# Patient Record
Sex: Female | Born: 1940 | Race: White | Hispanic: No | Marital: Single | Smoking: Never smoker
Health system: Southern US, Community
[De-identification: ages and names within clinical notes are randomized; demographics above are authoritative.]

## PROBLEM LIST (undated history)

## (undated) DIAGNOSIS — F419 Anxiety disorder, unspecified: Secondary | ICD-10-CM

## (undated) DIAGNOSIS — E039 Hypothyroidism, unspecified: Secondary | ICD-10-CM

## (undated) DIAGNOSIS — E7800 Pure hypercholesterolemia, unspecified: Secondary | ICD-10-CM

## (undated) DIAGNOSIS — R011 Cardiac murmur, unspecified: Secondary | ICD-10-CM

## (undated) DIAGNOSIS — C6991 Malignant neoplasm of unspecified site of right eye: Secondary | ICD-10-CM

## (undated) HISTORY — PX: BREAST SURGERY: SHX581

## (undated) HISTORY — DX: Pure hypercholesterolemia, unspecified: E78.00

## (undated) HISTORY — PX: BUNIONECTOMY: SHX129

## (undated) HISTORY — PX: OSTEOTOMY: SHX137

## (undated) HISTORY — DX: Hypothyroidism, unspecified: E03.9

## (undated) HISTORY — DX: Cardiac murmur, unspecified: R01.1

## (undated) HISTORY — DX: Anxiety disorder, unspecified: F41.9

## (undated) HISTORY — PX: HAMMER TOE SURGERY: SHX385

## (undated) HISTORY — PX: EYE SURGERY: SHX253

---

## 2000-03-23 ENCOUNTER — Encounter (INDEPENDENT_AMBULATORY_CARE_PROVIDER_SITE_OTHER): Payer: Self-pay | Admitting: *Deleted

## 2000-03-23 ENCOUNTER — Encounter: Payer: Self-pay | Admitting: *Deleted

## 2000-03-23 ENCOUNTER — Ambulatory Visit (HOSPITAL_BASED_OUTPATIENT_CLINIC_OR_DEPARTMENT_OTHER): Admission: RE | Admit: 2000-03-23 | Discharge: 2000-03-23 | Payer: Self-pay | Admitting: *Deleted

## 2001-06-30 ENCOUNTER — Other Ambulatory Visit: Admission: RE | Admit: 2001-06-30 | Discharge: 2001-06-30 | Payer: Self-pay | Admitting: *Deleted

## 2002-06-17 ENCOUNTER — Ambulatory Visit (HOSPITAL_COMMUNITY): Admission: RE | Admit: 2002-06-17 | Discharge: 2002-06-17 | Payer: Self-pay | Admitting: Gastroenterology

## 2002-07-08 ENCOUNTER — Other Ambulatory Visit: Admission: RE | Admit: 2002-07-08 | Discharge: 2002-07-08 | Payer: Self-pay

## 2002-10-31 ENCOUNTER — Ambulatory Visit (HOSPITAL_COMMUNITY): Admission: RE | Admit: 2002-10-31 | Discharge: 2002-10-31 | Payer: Self-pay | Admitting: Internal Medicine

## 2002-10-31 ENCOUNTER — Encounter: Payer: Self-pay | Admitting: Internal Medicine

## 2003-07-10 ENCOUNTER — Other Ambulatory Visit: Admission: RE | Admit: 2003-07-10 | Discharge: 2003-07-10 | Payer: Self-pay

## 2004-07-15 ENCOUNTER — Other Ambulatory Visit: Admission: RE | Admit: 2004-07-15 | Discharge: 2004-07-15 | Payer: Self-pay | Admitting: Family Medicine

## 2005-01-27 ENCOUNTER — Encounter: Admission: RE | Admit: 2005-01-27 | Discharge: 2005-01-27 | Payer: Self-pay | Admitting: Family Medicine

## 2005-04-02 ENCOUNTER — Encounter: Admission: RE | Admit: 2005-04-02 | Discharge: 2005-04-02 | Payer: Self-pay | Admitting: Gastroenterology

## 2005-05-23 ENCOUNTER — Encounter: Admission: RE | Admit: 2005-05-23 | Discharge: 2005-05-23 | Payer: Self-pay | Admitting: Gastroenterology

## 2005-05-23 IMAGING — CT CT ABDOMEN W/ CM
1 of 3 series · 14 of 32 positions shown, 19 images · IV contrast (REDICAT/H2O & OMNIPAQUE [ID])
Comparison: none

CLINICAL DATA: Right lower quadrant pain. 
 ABDOMEN CT WITH CONTRAST:
TECHNIQUE: Multidetector CT imaging of the abdomen was performed following the standard protocol during bolus administration of intravenous contrast.
 Contrast:  100 cc of Omnipaque 300.
 The lung bases are clear other than minimal linear scarring.  Small pericardial effusion is noted.  The liver enhances normally with no focal abnormality, and no ductal dilatation is seen.  No calcified gallstones are noted.  The pancreas is normal in size as are the adrenal glands and the spleen.  No adenopathy is seen.  The abdominal aorta is normal in caliber.
TECHNIQUE: Multidetector CT imaging of the pelvis was performed following the standard protocol during bolus administration of intravenous contrast.
 The appendix is well seen and appears normal as is the terminal ileum.  The urinary bladder is unremarkable.  Uterus is normal in size for age.  No adnexal lesion is seen, and no fluid is noted within the pelvis.  No adenopathy is noted.

[Series 2: — · axial · 0.70mm/px · z∈[-363,-43]mm · 14 of 72 slices shown, 19 images]
[im 4/72  soft-tissue]
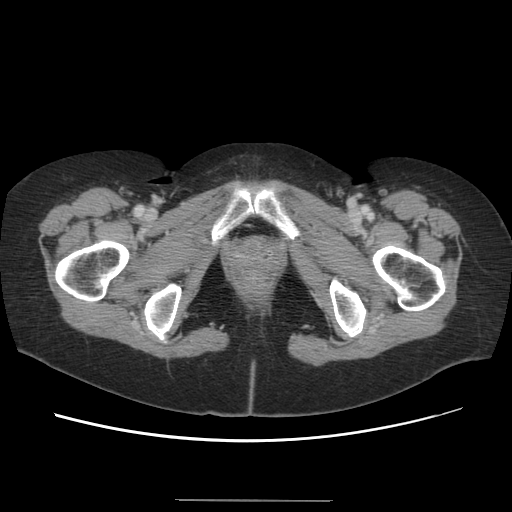
[im 4/72  bone]
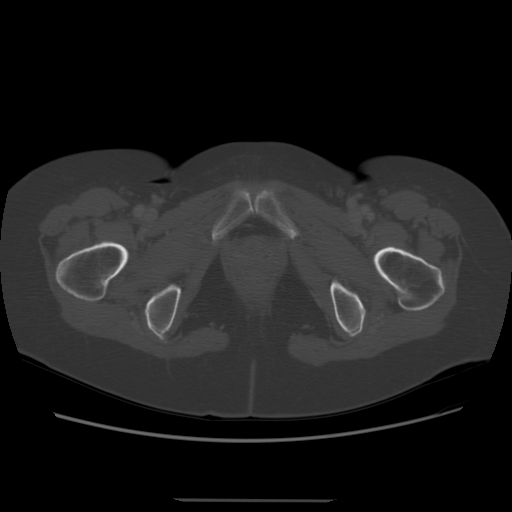
[im 12/72  soft-tissue]
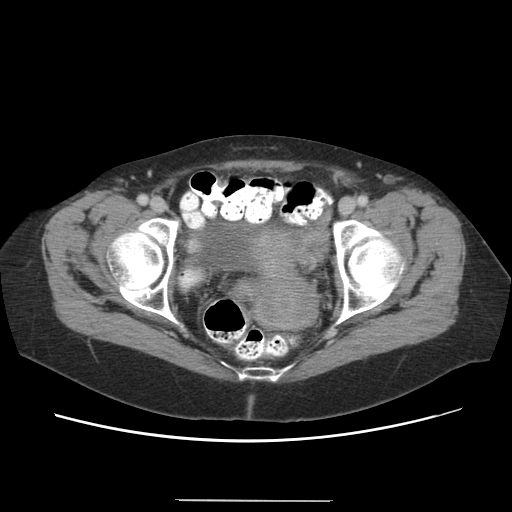
[im 15/72  soft-tissue]
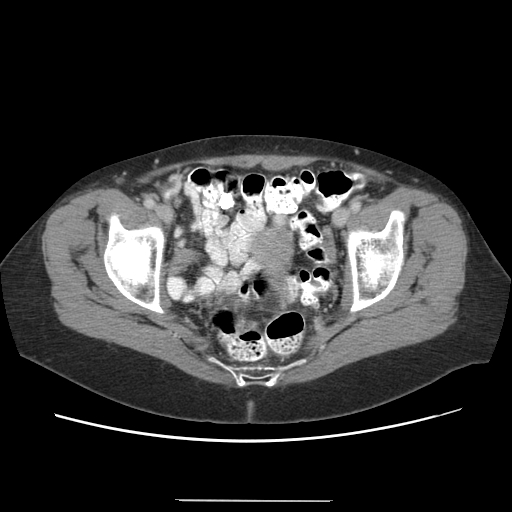
[im 19/72  soft-tissue]
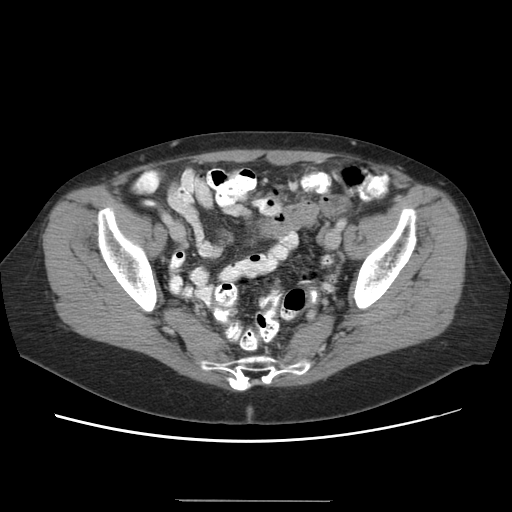
[im 27/72  soft-tissue]
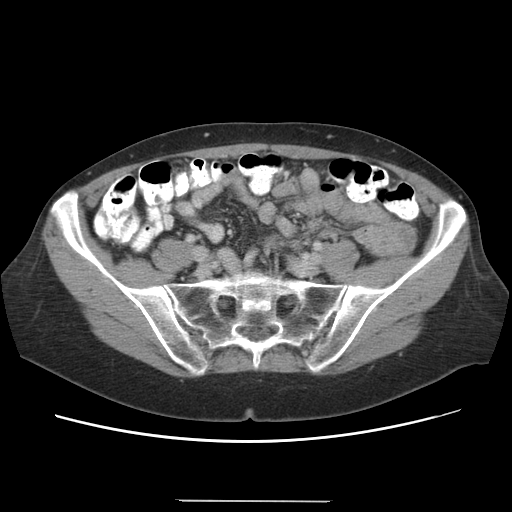
[im 30/72  soft-tissue]
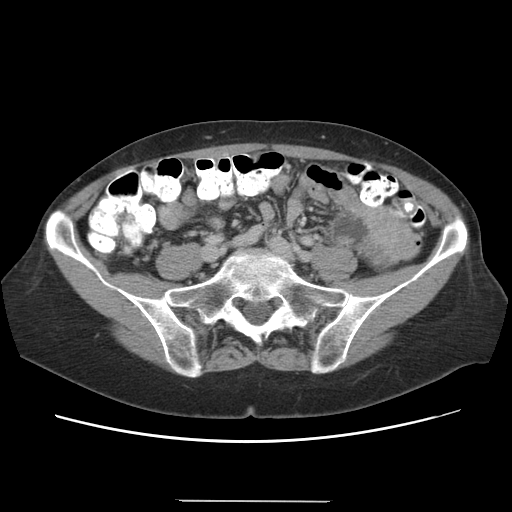
[im 38/72  soft-tissue]
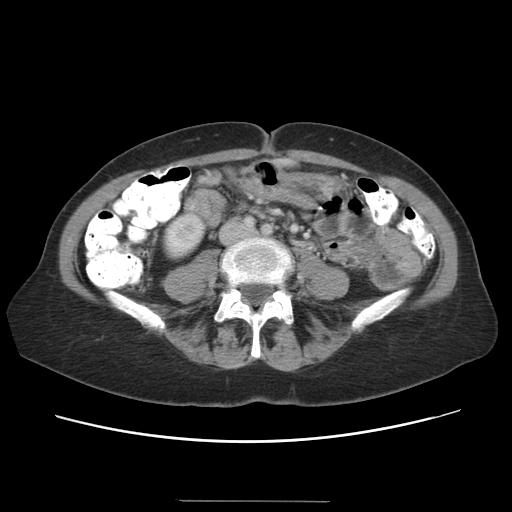
[im 42/72  soft-tissue]
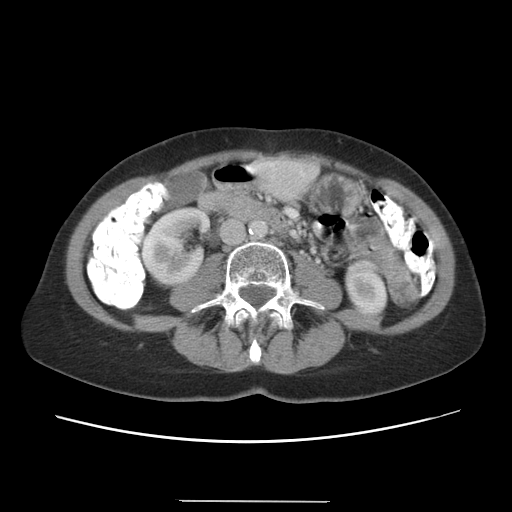
[im 45/72  soft-tissue]
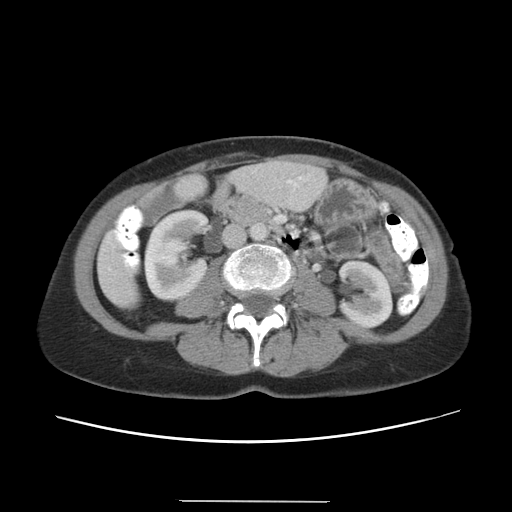
[im 45/72  bone]
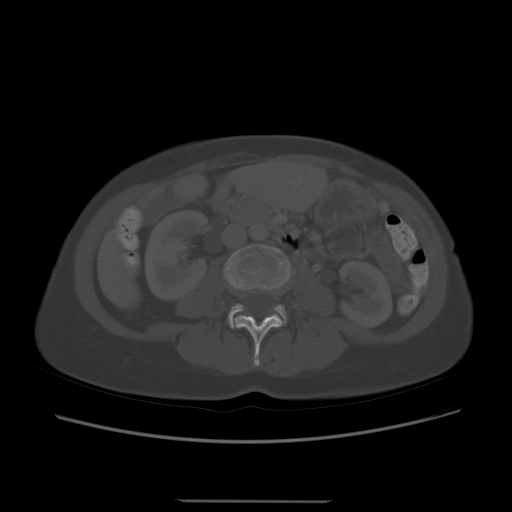
[im 53/72  soft-tissue]
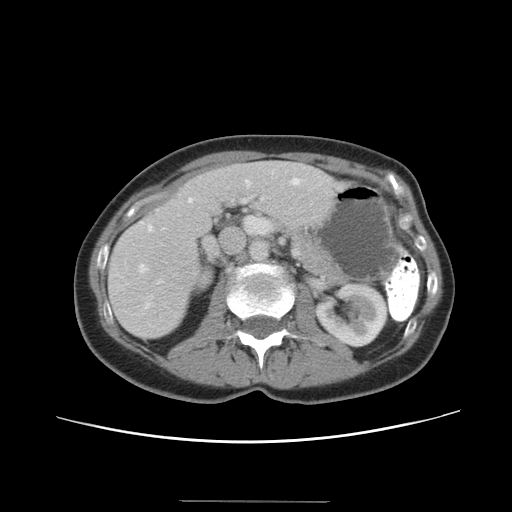
[im 57/72  soft-tissue]
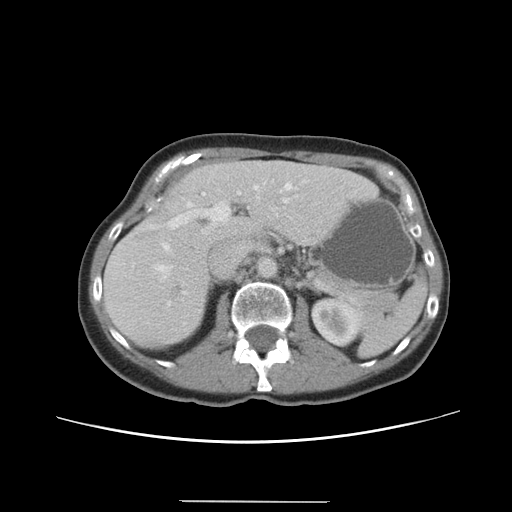
[im 57/72  lung]
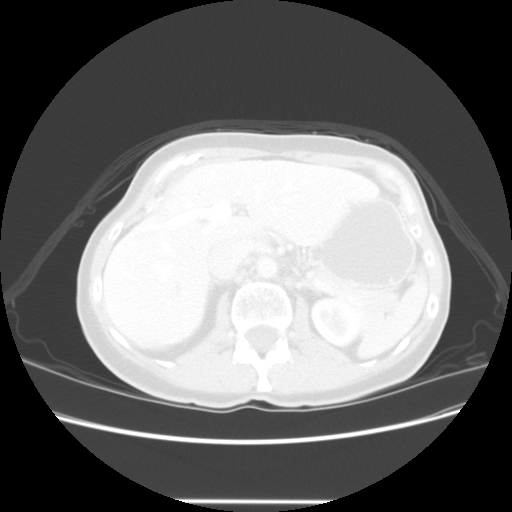
[im 60/72  soft-tissue]
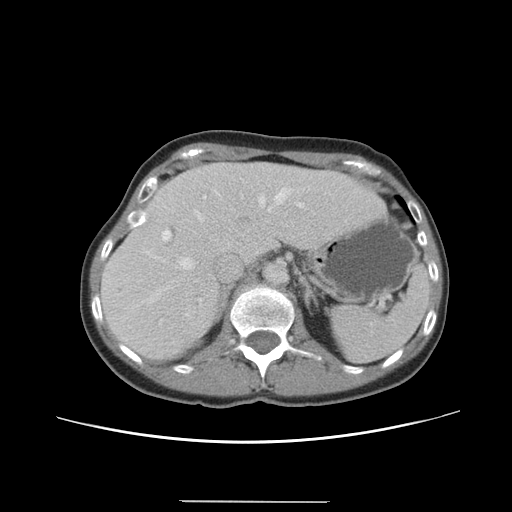
[im 60/72  lung]
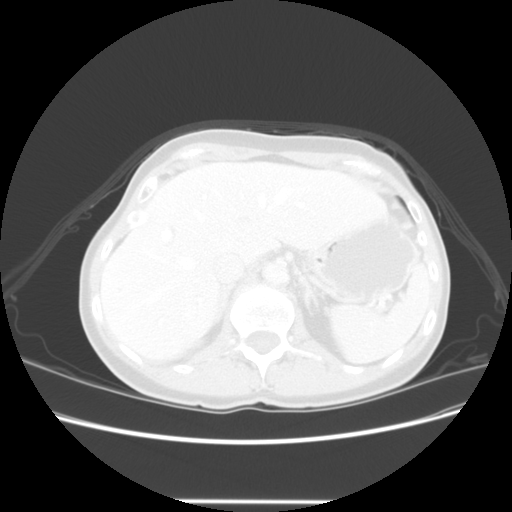
[im 64/72  lung]
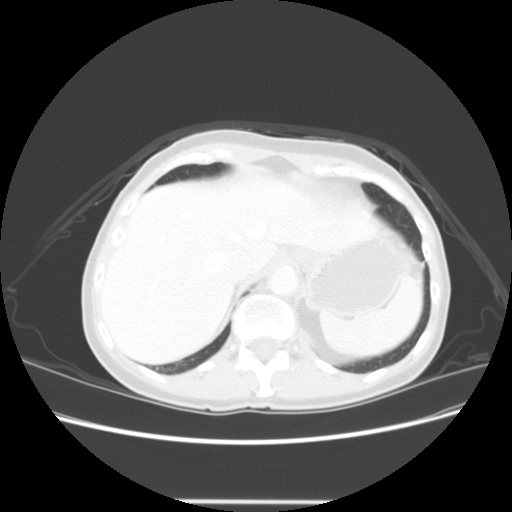
[im 68/72  soft-tissue]
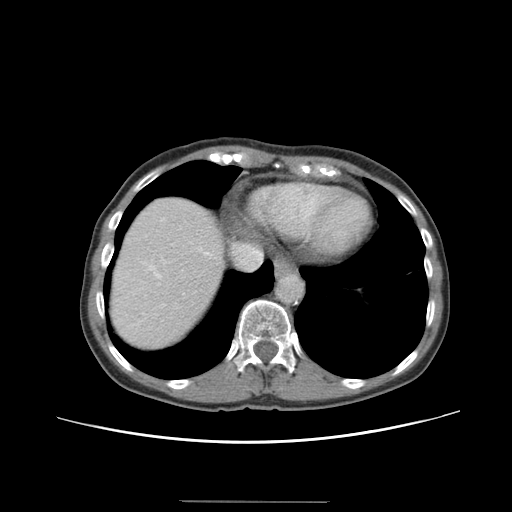
[im 68/72  lung]
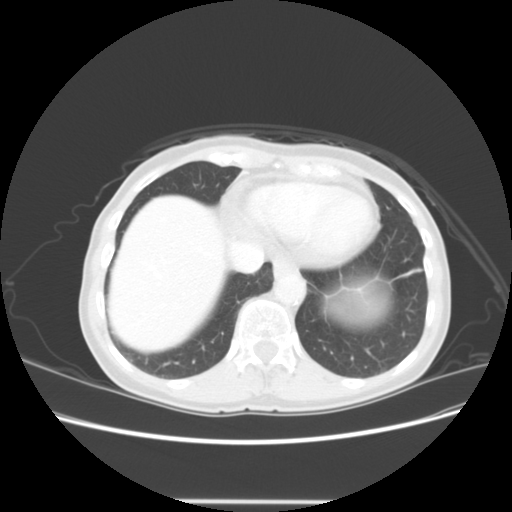

[14 of 32 positions shown; findings below may reference images not displayed]

IMPRESSION: Small pericardial effusion.  No abnormality on CT of the abdomen.
 PELVIS CT WITH CONTRAST:
IMPRESSION: Negative CT of the pelvis for acute abnormality.  The appendix and terminal ileum appear normal.

## 2005-07-28 ENCOUNTER — Encounter: Admission: RE | Admit: 2005-07-28 | Discharge: 2005-07-28 | Payer: Self-pay | Admitting: Family Medicine

## 2007-06-24 ENCOUNTER — Other Ambulatory Visit: Admission: RE | Admit: 2007-06-24 | Discharge: 2007-06-24 | Payer: Self-pay | Admitting: Obstetrics and Gynecology

## 2010-07-01 ENCOUNTER — Other Ambulatory Visit
Admission: RE | Admit: 2010-07-01 | Discharge: 2010-07-01 | Payer: Self-pay | Source: Home / Self Care | Admitting: Obstetrics and Gynecology

## 2010-10-18 DIAGNOSIS — E785 Hyperlipidemia, unspecified: Secondary | ICD-10-CM | POA: Insufficient documentation

## 2011-01-03 DIAGNOSIS — M2040 Other hammer toe(s) (acquired), unspecified foot: Secondary | ICD-10-CM | POA: Insufficient documentation

## 2011-01-03 DIAGNOSIS — L309 Dermatitis, unspecified: Secondary | ICD-10-CM | POA: Insufficient documentation

## 2011-01-31 NOTE — Op Note (Signed)
[place]  Patient:    Alison Marshall, Alison Marshall                      MRN: [id number] Proc. Date: 03/23/00 Attending:  Maisie Fus B. Samuella Cota, M.D. CC:         Heather Roberts, M.D.             Thomas B. Samuella Cota, M.D.                           Operative Report  CCS NUMBER [id number]  PREOPERATIVE DIAGNOSIS:  Abnormal mammogram, left breast.  POSTOPERATIVE DIAGNOSIS:  Abnormal mammogram, left breast.  OPERATION:  Needle localization and biopsy of left breast.  SURGEON:  Maisie Fus B. Samuella Cota, M.D.  ANESTHESIA:  1% Xylocaine local with anesthesia monitoring.  ANESTHESIOLOGIST:  Halford Decamp, M.D.  DESCRIPTION OF PROCEDURE:  The patient was taken to the operating room and placed on the table in supine position.  The left breast was prepped and draped in a sterile field.  The patient had a wire coming through the skin at about the 6 oclock position, angled towards the 3 oclock position.  A curved incision was outlined with the skin marker.  The area was then anesthetized with local and used throughout as necessary.  A small ellipse of skin was taken so as to remove the opening in the skin made by the wire.  Using the wire as a guide, the breast tissue was taken down to the chest wall.  Bleeding was controlled with the cautery.  The specimen was given to the radiologist who x-rayed the specimen and said the area of concern had been removed.  The specimen had been marked immediately with a short suture and latterly with a long suture.  The wound was irrigated.  Hemostasis was obtained.  Subcutaneous tissue was closed with 3-0 Vicryl, and the skin was closed with a running subcuticular suture of 5-0 Vicryl.  Benzoin and 1/2 inch Steri-Strips were used to reinforce the skin closure.  Pressure dressing using 4x4s, ABD, and 4-inch Hypafix was applied.  The patient seemed to tolerate the procedure well and was taken to the PACU in satisfactory condition. DD:   03/23/00 TD:  03/23/00 Job: [id number]

## 2011-01-31 NOTE — Op Note (Signed)
   Alison Marshall, Alison Marshall                         ACCOUNT NO.:  [id number]   MEDICAL RECORD NO.:  [id number]                   PATIENT TYPE:  AMB   LOCATION:  ENDO                                 FACILITY:  [facility]   PHYSICIAN:  John C. Madilyn Fireman, M.D.                 DATE OF BIRTH:  1940/10/24   DATE OF PROCEDURE:  06/17/2002  DATE OF DISCHARGE:                                 OPERATIVE REPORT   PROCEDURE:  Colonoscopy.   INDICATIONS FOR PROCEDURE:  Screening colonoscopy in a 70 year old patient  who also had complaints of constipation.   DESCRIPTION OF PROCEDURE:  The patient was placed in the left lateral  decubitus position then placed on the pulse monitor with continuous low flow  oxygen delivered by nasal cannula. She was sedated with 50 mg IV Demerol and  7 mg IV Versed. The Olympus video colonoscope was inserted into the rectum  and advanced to the cecum, confirmed by transillumination at McBurney's  point and visualization of the ileocecal valve and appendiceal orifice. The  prep was good. The cecum, ascending, transverse, descending and sigmoid  colon all appeared normal with no masses, polyps, diverticula or other  mucosal abnormalities. The rectum likewise appeared normal and retroflexed  view of the anus revealed no obvious internal hemorrhoids. The colonoscope  was then withdrawn and the patient returned to the recovery room in stable  condition. The patient tolerated the procedure well and there were no  immediate complications.   IMPRESSION:  Normal colonoscopy.   PLAN:  Colon screening by sigmoidoscopy in five years.                                               John C. Madilyn Fireman, M.D.    J[facility]/MEDQ  D:  06/17/2002  T:  06/17/2002  Job:  [id number]   cc:   Heather Roberts, M.D.  [address]  [place]  [place] [address]  Fax: [phone]

## 2011-02-12 ENCOUNTER — Encounter (INDEPENDENT_AMBULATORY_CARE_PROVIDER_SITE_OTHER): Payer: Self-pay | Admitting: Surgery

## 2011-05-12 DIAGNOSIS — N951 Menopausal and female climacteric states: Secondary | ICD-10-CM | POA: Insufficient documentation

## 2011-05-12 DIAGNOSIS — E559 Vitamin D deficiency, unspecified: Secondary | ICD-10-CM | POA: Insufficient documentation

## 2011-07-31 ENCOUNTER — Encounter: Payer: Self-pay | Admitting: Surgery

## 2011-09-23 ENCOUNTER — Ambulatory Visit (INDEPENDENT_AMBULATORY_CARE_PROVIDER_SITE_OTHER): Payer: Self-pay | Admitting: Surgery

## 2011-10-07 ENCOUNTER — Ambulatory Visit (INDEPENDENT_AMBULATORY_CARE_PROVIDER_SITE_OTHER): Payer: Self-pay | Admitting: Surgery

## 2011-11-03 ENCOUNTER — Encounter (INDEPENDENT_AMBULATORY_CARE_PROVIDER_SITE_OTHER): Payer: Self-pay | Admitting: General Surgery

## 2011-11-03 DIAGNOSIS — N6019 Diffuse cystic mastopathy of unspecified breast: Secondary | ICD-10-CM | POA: Insufficient documentation

## 2011-11-05 ENCOUNTER — Encounter (INDEPENDENT_AMBULATORY_CARE_PROVIDER_SITE_OTHER): Payer: Self-pay | Admitting: Surgery

## 2011-11-05 ENCOUNTER — Ambulatory Visit (INDEPENDENT_AMBULATORY_CARE_PROVIDER_SITE_OTHER): Payer: Medicare Other | Admitting: Surgery

## 2011-11-05 VITALS — BP 132/70 | HR 84 | Temp 98.8°F | Resp 16 | Ht 66.0 in | Wt 136.4 lb

## 2011-11-05 DIAGNOSIS — N6019 Diffuse cystic mastopathy of unspecified breast: Secondary | ICD-10-CM

## 2011-11-05 NOTE — Patient Instructions (Signed)
Continue with annual mammograms. We can see you back in a year, but you can also just follow up with Dr Duanne Guess

## 2011-11-05 NOTE — Progress Notes (Signed)
Chief complaint: Fibrocystic breast followup.  History of present illness: This patient comes in for annual fibrocystic breast checkups. She's had no new problems or issues with her breasts.  Past history family history review of systems are basically stable except he's recently had foot surgery for a hammertoe.  Examination Gen.: The patient is alert, oriented, and healthy-appearing. Breasts: The breast continued asymmetric density was no mass. She does have some loss of tissue in the lower outer left breast from prior surgery. Lymphatics: There is no axillary or supraclavicular adenopathy. Data reviewed: Mammogram report from August 2012 is negative.  Impression: Stable exam with fibrocystic changes.  Plan: I think that she will do well following up with her primary care. She may wish to come back here for followup as well but I think she should be appropriately followed by her primary physician.

## 2012-01-08 DIAGNOSIS — B351 Tinea unguium: Secondary | ICD-10-CM | POA: Insufficient documentation

## 2012-05-10 DIAGNOSIS — H00019 Hordeolum externum unspecified eye, unspecified eyelid: Secondary | ICD-10-CM | POA: Insufficient documentation

## 2012-06-02 ENCOUNTER — Encounter (INDEPENDENT_AMBULATORY_CARE_PROVIDER_SITE_OTHER): Payer: Self-pay

## 2012-07-09 ENCOUNTER — Other Ambulatory Visit: Payer: Self-pay | Admitting: Diagnostic Neuroimaging

## 2012-07-09 DIAGNOSIS — H532 Diplopia: Secondary | ICD-10-CM

## 2012-07-09 DIAGNOSIS — R42 Dizziness and giddiness: Secondary | ICD-10-CM

## 2012-07-19 ENCOUNTER — Other Ambulatory Visit: Payer: Self-pay

## 2012-07-19 ENCOUNTER — Ambulatory Visit
Admission: RE | Admit: 2012-07-19 | Discharge: 2012-07-19 | Disposition: A | Discharge status: Home / Self Care | Payer: Medicare Other | Source: Ambulatory Visit | Attending: Diagnostic Neuroimaging | Admitting: Diagnostic Neuroimaging

## 2012-07-19 DIAGNOSIS — R42 Dizziness and giddiness: Secondary | ICD-10-CM

## 2012-07-19 DIAGNOSIS — H532 Diplopia: Secondary | ICD-10-CM

## 2012-07-19 MED ORDER — GADOBENATE DIMEGLUMINE 529 MG/ML IV SOLN
12.0000 mL | Freq: Once | INTRAVENOUS | Status: AC | PRN
  Administered 2012-07-19: 12 mL via INTRAVENOUS

## 2012-08-02 DIAGNOSIS — N390 Urinary tract infection, site not specified: Secondary | ICD-10-CM | POA: Insufficient documentation

## 2012-08-18 DIAGNOSIS — C44101 Unspecified malignant neoplasm of skin of unspecified eyelid, including canthus: Secondary | ICD-10-CM | POA: Insufficient documentation

## 2012-09-13 ENCOUNTER — Ambulatory Visit: Payer: Self-pay | Admitting: Radiation Oncology

## 2012-09-16 ENCOUNTER — Ambulatory Visit: Payer: Self-pay | Admitting: Radiation Oncology

## 2012-09-21 DIAGNOSIS — C44320 Squamous cell carcinoma of skin of unspecified parts of face: Secondary | ICD-10-CM | POA: Insufficient documentation

## 2012-09-23 ENCOUNTER — Ambulatory Visit: Payer: Self-pay | Admitting: Radiation Oncology

## 2012-10-12 ENCOUNTER — Ambulatory Visit: Payer: Self-pay | Admitting: Otolaryngology

## 2012-10-12 DIAGNOSIS — E782 Mixed hyperlipidemia: Secondary | ICD-10-CM

## 2012-10-14 ENCOUNTER — Ambulatory Visit: Payer: Self-pay | Admitting: Otolaryngology

## 2012-10-16 ENCOUNTER — Ambulatory Visit: Payer: Self-pay | Admitting: Radiation Oncology

## 2012-11-02 LAB — PROTIME-INR: Prothrombin Time: 12.9 secs (ref 11.5–14.7)

## 2012-11-02 LAB — CBC CANCER CENTER
Eosinophil #: 0.1 x10 3/mm (ref 0.0–0.7)
HCT: 39.3 % (ref 35.0–47.0)
HGB: 13 g/dL (ref 12.0–16.0)
Lymphocyte #: 1.7 x10 3/mm (ref 1.0–3.6)
Lymphocyte %: 27.3 %
MCH: 30 pg (ref 26.0–34.0)
MCHC: 33.2 g/dL (ref 32.0–36.0)
Monocyte #: 0.6 x10 3/mm (ref 0.2–0.9)
Platelet: 290 x10 3/mm (ref 150–440)
RBC: 4.35 10*6/uL (ref 3.80–5.20)

## 2012-11-02 LAB — COMPREHENSIVE METABOLIC PANEL
BUN: 13 mg/dL (ref 7–18)
Bilirubin,Total: 0.4 mg/dL (ref 0.2–1.0)
Calcium, Total: 9.5 mg/dL (ref 8.5–10.1)
Co2: 29 mmol/L (ref 21–32)
SGOT(AST): 26 U/L (ref 15–37)

## 2012-11-02 LAB — APTT: Activated PTT: 28.1 secs (ref 23.6–35.9)

## 2012-11-06 ENCOUNTER — Encounter: Payer: Self-pay | Admitting: Diagnostic Neuroimaging

## 2012-11-06 DIAGNOSIS — H532 Diplopia: Secondary | ICD-10-CM | POA: Insufficient documentation

## 2012-11-06 DIAGNOSIS — R519 Headache, unspecified: Secondary | ICD-10-CM | POA: Insufficient documentation

## 2012-11-10 LAB — BASIC METABOLIC PANEL
BUN: 11 mg/dL (ref 7–18)
Chloride: 101 mmol/L (ref 98–107)
Co2: 30 mmol/L (ref 21–32)
EGFR (Non-African Amer.): 50 — ABNORMAL LOW
Potassium: 4.1 mmol/L (ref 3.5–5.1)
Sodium: 142 mmol/L (ref 136–145)

## 2012-11-10 LAB — CBC CANCER CENTER
Eosinophil #: 0.2 x10 3/mm (ref 0.0–0.7)
HCT: 40 % (ref 35.0–47.0)
MCH: 30.3 pg (ref 26.0–34.0)
MCHC: 33.7 g/dL (ref 32.0–36.0)
MCV: 90 fL (ref 80–100)
Neutrophil %: 61.4 %
RBC: 4.46 10*6/uL (ref 3.80–5.20)
RDW: 14 % (ref 11.5–14.5)

## 2012-11-13 ENCOUNTER — Ambulatory Visit: Payer: Self-pay | Admitting: Radiation Oncology

## 2012-11-17 LAB — CBC CANCER CENTER
Basophil #: 0 x10 3/mm (ref 0.0–0.1)
Basophil %: 0.7 %
Eosinophil #: 0.1 x10 3/mm (ref 0.0–0.7)
Eosinophil %: 1.5 %
HGB: 13.2 g/dL (ref 12.0–16.0)
Lymphocyte #: 0.9 x10 3/mm — ABNORMAL LOW (ref 1.0–3.6)
Lymphocyte %: 14.2 %
MCV: 90 fL (ref 80–100)
Monocyte %: 9.8 %
Platelet: 274 x10 3/mm (ref 150–440)

## 2012-11-17 LAB — COMPREHENSIVE METABOLIC PANEL
Albumin: 3.8 g/dL (ref 3.4–5.0)
Alkaline Phosphatase: 69 U/L (ref 50–136)
Bilirubin,Total: 0.3 mg/dL (ref 0.2–1.0)
Calcium, Total: 9.1 mg/dL (ref 8.5–10.1)
Creatinine: 1.02 mg/dL (ref 0.60–1.30)
EGFR (African American): >60
EGFR (Non-African Amer.): 55 — ABNORMAL LOW
Potassium: 4.4 mmol/L (ref 3.5–5.1)

## 2012-11-24 LAB — COMPREHENSIVE METABOLIC PANEL
Albumin: 3.9 g/dL (ref 3.4–5.0)
BUN: 13 mg/dL (ref 7–18)
Creatinine: 1.07 mg/dL (ref 0.60–1.30)
EGFR (African American): >60
Glucose: 66 mg/dL (ref 65–99)
Osmolality: 281 (ref 275–301)
SGOT(AST): 21 U/L (ref 15–37)
SGPT (ALT): 23 U/L (ref 12–78)
Sodium: 142 mmol/L (ref 136–145)
Total Protein: 7.3 g/dL (ref 6.4–8.2)

## 2012-11-24 LAB — CBC CANCER CENTER
Eosinophil #: 0.1 x10 3/mm (ref 0.0–0.7)
Eosinophil %: 1.5 %
HCT: 38.5 % (ref 35.0–47.0)
Lymphocyte #: 0.7 x10 3/mm — ABNORMAL LOW (ref 1.0–3.6)
MCV: 91 fL (ref 80–100)
Monocyte %: 11.9 %
Neutrophil #: 4 x10 3/mm (ref 1.4–6.5)
Platelet: 287 x10 3/mm (ref 150–440)
WBC: 5.5 x10 3/mm (ref 3.6–11.0)

## 2012-12-01 LAB — BASIC METABOLIC PANEL
Anion Gap: 7 (ref 7–16)
BUN: 11 mg/dL (ref 7–18)
Calcium, Total: 9.3 mg/dL (ref 8.5–10.1)
Chloride: 101 mmol/L (ref 98–107)
EGFR (African American): >60
EGFR (Non-African Amer.): 52 — ABNORMAL LOW
Glucose: 88 mg/dL (ref 65–99)
Potassium: 4.1 mmol/L (ref 3.5–5.1)
Sodium: 138 mmol/L (ref 136–145)

## 2012-12-01 LAB — CBC CANCER CENTER
Basophil #: 0 x10 3/mm (ref 0.0–0.1)
Basophil %: 0.9 %
HCT: 37.7 % (ref 35.0–47.0)
MCH: 30.8 pg (ref 26.0–34.0)
MCHC: 33.9 g/dL (ref 32.0–36.0)
MCV: 91 fL (ref 80–100)
Platelet: 290 x10 3/mm (ref 150–440)
RBC: 4.15 10*6/uL (ref 3.80–5.20)
WBC: 4.8 x10 3/mm (ref 3.6–11.0)

## 2012-12-08 LAB — COMPREHENSIVE METABOLIC PANEL
Albumin: 3.8 g/dL (ref 3.4–5.0)
Anion Gap: 9 (ref 7–16)
BUN: 8 mg/dL (ref 7–18)
Bilirubin,Total: 0.3 mg/dL (ref 0.2–1.0)
Calcium, Total: 9 mg/dL (ref 8.5–10.1)
Chloride: 101 mmol/L (ref 98–107)
Co2: 29 mmol/L (ref 21–32)
EGFR (African American): 56 — ABNORMAL LOW
EGFR (Non-African Amer.): 49 — ABNORMAL LOW
Glucose: 86 mg/dL (ref 65–99)
Osmolality: 275 (ref 275–301)
Potassium: 4.2 mmol/L (ref 3.5–5.1)
SGOT(AST): 21 U/L (ref 15–37)
Sodium: 139 mmol/L (ref 136–145)
Total Protein: 7 g/dL (ref 6.4–8.2)

## 2012-12-08 LAB — CBC CANCER CENTER
Basophil #: 0 x10 3/mm (ref 0.0–0.1)
Basophil %: 0.9 %
Eosinophil #: 0.1 x10 3/mm (ref 0.0–0.7)
Eosinophil %: 1.6 %
HCT: 38.2 % (ref 35.0–47.0)
Lymphocyte %: 11.5 %
MCHC: 34.1 g/dL (ref 32.0–36.0)
MCV: 91 fL (ref 80–100)
Monocyte #: 0.6 x10 3/mm (ref 0.2–0.9)
Monocyte %: 12.7 %
Neutrophil #: 3.5 x10 3/mm (ref 1.4–6.5)
Neutrophil %: 73.3 %
Platelet: 256 x10 3/mm (ref 150–440)
RDW: 15.2 % — ABNORMAL HIGH (ref 11.5–14.5)

## 2012-12-14 ENCOUNTER — Ambulatory Visit: Payer: Self-pay | Admitting: Radiation Oncology

## 2012-12-15 LAB — CBC CANCER CENTER
Basophil #: 0 x10 3/mm (ref 0.0–0.1)
HGB: 12.7 g/dL (ref 12.0–16.0)
Lymphocyte %: 11.3 %
MCHC: 33.3 g/dL (ref 32.0–36.0)
Monocyte #: 0.7 x10 3/mm (ref 0.2–0.9)
Monocyte %: 12.1 %
Neutrophil #: 4.3 x10 3/mm (ref 1.4–6.5)
Neutrophil %: 75.1 %
RBC: 4.16 10*6/uL (ref 3.80–5.20)
WBC: 5.7 x10 3/mm (ref 3.6–11.0)

## 2013-01-13 ENCOUNTER — Ambulatory Visit: Payer: Self-pay | Admitting: Radiation Oncology

## 2013-01-19 LAB — COMPREHENSIVE METABOLIC PANEL
BUN: 14 mg/dL (ref 7–18)
Bilirubin,Total: 0.4 mg/dL (ref 0.2–1.0)
Co2: 26 mmol/L (ref 21–32)
Creatinine: 0.94 mg/dL (ref 0.60–1.30)
EGFR (Non-African Amer.): >60
SGPT (ALT): 28 U/L (ref 12–78)
Sodium: 141 mmol/L (ref 136–145)
Total Protein: 7.2 g/dL (ref 6.4–8.2)

## 2013-01-19 LAB — CBC CANCER CENTER
Basophil %: 0.7 %
Eosinophil #: 0 x10 3/mm (ref 0.0–0.7)
Eosinophil %: 0.9 %
HGB: 13 g/dL (ref 12.0–16.0)
Lymphocyte #: 0.5 x10 3/mm — ABNORMAL LOW (ref 1.0–3.6)
Lymphocyte %: 10 %
MCH: 31.5 pg (ref 26.0–34.0)
Monocyte #: 0.6 x10 3/mm (ref 0.2–0.9)
Monocyte %: 11.7 %
Neutrophil %: 76.7 %
Platelet: 259 x10 3/mm (ref 150–440)
RBC: 4.14 10*6/uL (ref 3.80–5.20)
RDW: 18.6 % — ABNORMAL HIGH (ref 11.5–14.5)
WBC: 4.9 x10 3/mm (ref 3.6–11.0)

## 2013-02-13 ENCOUNTER — Ambulatory Visit: Payer: Self-pay | Admitting: Radiation Oncology

## 2013-03-01 ENCOUNTER — Ambulatory Visit: Payer: Self-pay | Admitting: Oncology

## 2013-03-03 LAB — COMPREHENSIVE METABOLIC PANEL
Albumin: 3.6 g/dL (ref 3.4–5.0)
Alkaline Phosphatase: 60 U/L (ref 50–136)
Calcium, Total: 9.2 mg/dL (ref 8.5–10.1)
Chloride: 102 mmol/L (ref 98–107)
Creatinine: 1.04 mg/dL (ref 0.60–1.30)
EGFR (Non-African Amer.): 53 — ABNORMAL LOW
Glucose: 135 mg/dL — ABNORMAL HIGH (ref 65–99)
SGOT(AST): 18 U/L (ref 15–37)
SGPT (ALT): 33 U/L (ref 12–78)
Sodium: 136 mmol/L (ref 136–145)
Total Protein: 6.9 g/dL (ref 6.4–8.2)

## 2013-03-03 LAB — CBC CANCER CENTER
Basophil #: 0 x10 3/mm (ref 0.0–0.1)
Basophil %: 0.1 %
HCT: 37.4 % (ref 35.0–47.0)
Monocyte #: 0.5 x10 3/mm (ref 0.2–0.9)
RBC: 3.92 10*6/uL (ref 3.80–5.20)

## 2013-03-15 ENCOUNTER — Ambulatory Visit: Payer: Self-pay | Admitting: Radiation Oncology

## 2013-07-01 ENCOUNTER — Ambulatory Visit: Payer: Self-pay | Admitting: Radiation Oncology

## 2013-07-15 DIAGNOSIS — H2513 Age-related nuclear cataract, bilateral: Secondary | ICD-10-CM | POA: Insufficient documentation

## 2013-07-15 DIAGNOSIS — C6900 Malignant neoplasm of unspecified conjunctiva: Secondary | ICD-10-CM | POA: Insufficient documentation

## 2013-07-16 ENCOUNTER — Ambulatory Visit: Payer: Self-pay | Admitting: Radiation Oncology

## 2013-08-15 ENCOUNTER — Ambulatory Visit: Payer: Self-pay | Admitting: Radiation Oncology

## 2013-08-15 LAB — CBC CANCER CENTER
HGB: 14.4 g/dL (ref 12.0–16.0)
Lymphocyte %: 14.1 %
MCV: 91 fL (ref 80–100)
Monocyte #: 0.7 x10 3/mm (ref 0.2–0.9)
Monocyte %: 11.2 %
Neutrophil #: 4.7 x10 3/mm (ref 1.4–6.5)
RDW: 14.4 % (ref 11.5–14.5)

## 2013-08-15 LAB — COMPREHENSIVE METABOLIC PANEL
Albumin: 4.1 g/dL (ref 3.4–5.0)
Alkaline Phosphatase: 47 U/L
Anion Gap: 7 (ref 7–16)
BUN: 16 mg/dL (ref 7–18)
Calcium, Total: 10.3 mg/dL — ABNORMAL HIGH (ref 8.5–10.1)
Co2: 30 mmol/L (ref 21–32)
Creatinine: 1.01 mg/dL (ref 0.60–1.30)
EGFR (African American): >60
EGFR (Non-African Amer.): 55 — ABNORMAL LOW
Glucose: 101 mg/dL — ABNORMAL HIGH (ref 65–99)
Osmolality: 279 (ref 275–301)
SGPT (ALT): 22 U/L (ref 12–78)
Total Protein: 7.6 g/dL (ref 6.4–8.2)

## 2013-09-15 ENCOUNTER — Ambulatory Visit: Payer: Self-pay | Admitting: Radiation Oncology

## 2013-09-28 DIAGNOSIS — E7800 Pure hypercholesterolemia, unspecified: Secondary | ICD-10-CM | POA: Insufficient documentation

## 2013-09-28 DIAGNOSIS — F330 Major depressive disorder, recurrent, mild: Secondary | ICD-10-CM | POA: Insufficient documentation

## 2013-09-28 DIAGNOSIS — H5710 Ocular pain, unspecified eye: Secondary | ICD-10-CM | POA: Insufficient documentation

## 2013-10-16 ENCOUNTER — Ambulatory Visit: Payer: Self-pay | Admitting: Family Medicine

## 2014-01-06 DIAGNOSIS — H00029 Hordeolum internum unspecified eye, unspecified eyelid: Secondary | ICD-10-CM | POA: Insufficient documentation

## 2014-01-06 DIAGNOSIS — L589 Radiodermatitis, unspecified: Secondary | ICD-10-CM | POA: Insufficient documentation

## 2014-01-27 ENCOUNTER — Ambulatory Visit: Payer: Self-pay | Admitting: Radiation Oncology

## 2014-02-20 ENCOUNTER — Ambulatory Visit: Payer: Self-pay | Admitting: Oncology

## 2015-01-05 NOTE — Consult Note (Signed)
Reason for Visit: This 74 year old Female patient presents to the clinic for initial evaluation of  squamous carcinoma of the right conjunctiva .   Referred by Self Referral.  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 This was initially treated conservatively with eyedrops although progressed to diplopia. She was again seen by her ophthalmologist who noted a conjunctival mass of the right eye performed 12:15 2013 at [facility] showing poorly characterized 1.4 x 1.2 cm anterior lateral and inferior right orbital enhancing mass with T1-T2 hypodiploid 2010 see regular soft tissue mass abutting the globe. CT scan of the chest was normal. By MRI criteria of the inferior and lateral rectus muscles were involved. Also possibility of invasion of the right globe. Patient was seen by Dr. Vickki Muff at [facility] and biopsy was performed on December 3 with pathology revealing poorly differentiated invasive squamous cell carcinoma with desmoplastic features from the conjunctiva. She's been evaluated by radiation oncology. Patient was offered debulking surgery although has declined. Also noted on her scans was a right preauricular node. No PET CT scan was performed. Patient to seek in the second opinion in [place] closer to home. Her sister is a former patient of mine. She seen today in doing fairly well. she still complains of diplopia and itching of the eye.  Past Hx:    Cancer Right Eye:     Hypercholesterolemia:    Hypothyroidism:   Past, Family and Social History:   Past Medical History positive    Cardiovascular hyperlipidemia    Endocrine hypothyroidism    Family History noncontributory    Social History noncontributory    Additional Past Medical and Surgical History accompanied by sister today   Home Meds:  Home Medications: Medication Instructions Status  Synthroid 75 mcg (0.075 mg) oral tablet 1 tab(s) orally once a day Active  atorvastatin 10 mg oral tablet 1 tab(s) orally once a day (at bedtime) Active  Calcium  2 tab(s) orally 2 times a day Active  Multiple Vitamin 1 tab(s) orally once a day Active  Motrin IB 200 mg oral tablet 2 tab(s) orally 2 times a day, As Needed- for Headache  Active   Review of Systems:   General negative    Performance Status (ECOG) 0    Skin negative    Breast negative    Ophthalmologic see HPI    ENMT negative    Respiratory and Thorax negative    Cardiovascular negative    Gastrointestinal negative    Genitourinary negative    Musculoskeletal negative    Neurological negative    Hematology/Lymphatics negative    Endocrine see HPI    Allergic/Immunologic negative    Review of Systems   according to nurse's notesPatient denies any weight loss, fatigue, weakness, fever, chills or night sweats. Patient denies any loss of vision, blurred vision. Patient denies any ringing  of the ears or hearing loss. No irregular heartbeat. Patient denies heart murmur or history of fainting. Patient denies any chest pain or  pain radiating to her upper extremities. Patient denies any shortness of breath, difficulty breathing at night, cough or hemoptysis. Patient denies any swelling in the lower legs. Patient denies any nausea vomiting, vomiting of blood, or coffee ground material in the vomitus. Patient denies any stomach pain. Patient states has had normal bowel movements no significant constipation or diarrhea. Patient  denies any dysuria, hematuria or significant nocturia. Patient denies any problems walking, swelling in the joints or loss of balance. Patient denies any skin changes, loss of hair or loss of weight. Patient denies any excessive worrying or anxiety or significant depression. Patient denies any problems with insomnia. Patient denies excessive thirst, polyuria, polydipsia. Patient denies any swollen glands, patient denies easy bruising or easy bleeding. Patient denies any recent infections, allergies or URI. Patient "s visual fields have not changed significantly in recent time.  Nursing Notes:  Nursing Vital Signs and Chemo Nursing Nursing Notes: *CC Vital Signs Flowsheet:   02-Jan-14 13:21   Temp Temperature 98.2   Pulse Pulse 69   Respirations Respirations 20   SBP SBP 154   DBP DBP 92   Current Weight (kg) (kg) 58.1   Height (cm) centimeters 169   BSA (m2) 1.6   Physical Exam:  General/Skin/HEENT:   General normal    Skin normal    Additional PE well-developed thin female in NAD. There is a mass present in the inferior aspect of the orbit somewhat hemorrhagic in appearance. Crude visual fields are within normal range. Fundi are benign bilaterally. Intraocular motion is intact. Oral cavity is clear with no oral mucosal lesions noted. Indirect mirror examination shows upper airway clear vallecula and base of tongue within normal limits. There some subtle subcentimeter lymph nodes present in the right cervical chain and some fullness in the lower left cervical chain. Supraclavicular fossa is are clear. Cranial nerves II through XII are grossly intact there is some weakness on downward gaze.lungs are clear to A&P cardiac examination shows regular rate and rhythm.   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  Plan:   at this time I have ordered a PET/CT scan to fully stage the patient patient. Would like to know if there's any extraocular lymph node metastasis that we need to be concerned about. Patient is reluctant to have eye surgery up front at this time. I've also set up an appointment with medical oncology and talked to my medical oncology colleagues about possibility of concurrent chemoradiation with curative intent. At this time until formal plan is finalized have had a discussion with the patient on risks and benefits of overall radiation to the right orbit. Certainly based on dry eye or ocular pain sacrifice of the orbit down the road may be a real possibility. Followup appointment after PET/CT and new patient consultation with medical oncology was arranged today.  I would like to take this opportunity to thank you for allowing me to continue to participate in this patient's care.  CC Referral:   cc: Dr. Vickki Muff [facility], Dr. Drema Pry [facility] Dr. Mardene Celeste in [place] Dr. Rachell Cipro [facility] in [place]: Baruch Gouty Roda Shutters (MD)  (  Signed 02-Jan-14 16:00)  Authored: HPI, Diagnosis, Past Hx, PFSH, Home Meds, ROS, Nursing Notes, Physical Exam, Encounter Assessment and Plan, CC Referring Physician   Last Updated: 02-Jan-14 16:00 by Armstead Peaks (MD)

## 2015-01-05 NOTE — Op Note (Signed)
DATE OF BIRTH:  1941-01-01  DATE OF PROCEDURE:  10/14/2012  SURGEON:  Janalee Dane, MD   PREOPERATIVE DIAGNOSIS:  Right parotid mass.   POSTOPERATIVE DIAGNOSIS:  Right parotid mass.   PROCEDURE:  Right superficial parotidectomy with facial nerve dissection.   DESCRIPTION OF THE PROCEDURE:  The patient was identified in the holding area, brought back to the operating room and placed in the supine position on the operating room table. After general endotracheal anesthesia had been induced, the patient was turned 90 degrees counterclockwise from anesthesia. The patient was placed in a head turned left position. The parotid incision was marked, locally anesthetized, prepped and draped in the usual fashion. A 15 blade was used to make an incision through the skin in the marked area. The anterior flap was elevated and retracted with dural hooks. The ear lobule was retracted with a dural hook. The dissection was then carried down in a wide plane to allow for adequate facial nerve identification. The facial nerve main trunk was identified in the stylomastoid foramen. The superior and inferior divisions were identified. The superior division was dissected out inferior to the mass. The mass was kept in palpation during the entire procedure, and the facial nerve branches were individually traced out, and the mass and parotid were elevated off of the facial nerve from an inferior to superior direction. Once the parotid had been removed and sent for frozen section analysis, the wound was checked for meticulous hemostasis. This was achieved. The wound was then closed over a 7-mm Jackson-Pratt drain with 4-0 Vicryl and 6-0 nylon sutures. The patient was then returned to anesthesia, allowed to emerge from anesthesia in the operating room and taken to the recovery room in stable condition. There were no complications.   ESTIMATED BLOOD LOSS:  25 mL.   ____________________________ J. Nadeen Landau,  MD jmc:ms D: 10/14/2012 20:43:18 ET T: 10/14/2012 22:59:53 ET JOB#: [id number]  cc: Janalee Dane, MD, <Dictator> Nicholos Johns MD ELECTRONICALLY SIGNED 10/26/2012 19:35

## 2015-03-05 ENCOUNTER — Encounter: Payer: Self-pay | Admitting: *Deleted

## 2015-03-05 ENCOUNTER — Encounter
Admission: RE | Disposition: A | Discharge status: Home / Self Care | Payer: Self-pay | Source: Ambulatory Visit | Attending: Unknown Physician Specialty

## 2015-03-05 ENCOUNTER — Ambulatory Visit: Payer: Medicare Other | Admitting: Anesthesiology

## 2015-03-05 ENCOUNTER — Ambulatory Visit
Admission: RE | Admit: 2015-03-05 | Discharge: 2015-03-05 | Disposition: A | Discharge status: Home / Self Care | Payer: Medicare Other | Source: Ambulatory Visit | Attending: Unknown Physician Specialty | Admitting: Unknown Physician Specialty

## 2015-03-05 DIAGNOSIS — Z1211 Encounter for screening for malignant neoplasm of colon: Secondary | ICD-10-CM | POA: Diagnosis present

## 2015-03-05 DIAGNOSIS — K5730 Diverticulosis of large intestine without perforation or abscess without bleeding: Secondary | ICD-10-CM | POA: Insufficient documentation

## 2015-03-05 DIAGNOSIS — Z888 Allergy status to other drugs, medicaments and biological substances status: Secondary | ICD-10-CM | POA: Diagnosis not present

## 2015-03-05 DIAGNOSIS — Z79899 Other long term (current) drug therapy: Secondary | ICD-10-CM | POA: Insufficient documentation

## 2015-03-05 DIAGNOSIS — Z885 Allergy status to narcotic agent status: Secondary | ICD-10-CM | POA: Diagnosis not present

## 2015-03-05 DIAGNOSIS — Z9889 Other specified postprocedural states: Secondary | ICD-10-CM | POA: Diagnosis not present

## 2015-03-05 DIAGNOSIS — Z7982 Long term (current) use of aspirin: Secondary | ICD-10-CM | POA: Insufficient documentation

## 2015-03-05 DIAGNOSIS — Z882 Allergy status to sulfonamides status: Secondary | ICD-10-CM | POA: Insufficient documentation

## 2015-03-05 DIAGNOSIS — E039 Hypothyroidism, unspecified: Secondary | ICD-10-CM | POA: Diagnosis not present

## 2015-03-05 DIAGNOSIS — K640 First degree hemorrhoids: Secondary | ICD-10-CM | POA: Insufficient documentation

## 2015-03-05 DIAGNOSIS — Z85840 Personal history of malignant neoplasm of eye: Secondary | ICD-10-CM | POA: Insufficient documentation

## 2015-03-05 HISTORY — PX: COLONOSCOPY WITH PROPOFOL: SHX5780

## 2015-03-05 SURGERY — COLONOSCOPY WITH PROPOFOL
Anesthesia: General

## 2015-03-05 MED ORDER — LIDOCAINE HCL (PF) 2 % IJ SOLN
INTRAMUSCULAR | Status: DC | PRN
  Administered 2015-03-05: 50 mg

## 2015-03-05 MED ORDER — PROPOFOL INFUSION 10 MG/ML OPTIME
INTRAVENOUS | Status: DC | PRN
  Administered 2015-03-05: 75 ug/kg/min via INTRAVENOUS

## 2015-03-05 MED ORDER — LACTATED RINGERS IV SOLN
Freq: Once | INTRAVENOUS | Status: AC
  Administered 2015-03-05: 14:00:00 via INTRAVENOUS

## 2015-03-05 MED ORDER — FENTANYL CITRATE (PF) 100 MCG/2ML IJ SOLN
INTRAMUSCULAR | Status: DC | PRN
  Administered 2015-03-05: 50 ug via INTRAVENOUS

## 2015-03-05 MED ORDER — LACTATED RINGERS IV SOLN
INTRAVENOUS | Status: DC | PRN
  Administered 2015-03-05: 14:00:00 via INTRAVENOUS

## 2015-03-05 MED ORDER — PROPOFOL 10 MG/ML IV BOLUS
INTRAVENOUS | Status: DC | PRN
  Administered 2015-03-05 (×2): 10 mg via INTRAVENOUS
  Administered 2015-03-05: 30 mg via INTRAVENOUS

## 2015-03-05 NOTE — Op Note (Signed)
[facility]astroenterology Patient Name: Alison Marshall Procedure Date: 03/05/2015 1:06 PM MRN: [id number] Account #: [id number] Date of Birth: 01/01/1941 Admit Type: Outpatient Age: 74 Room: [facility] ENDO ROOM 1 Gender: Female Note Status: Finalized Procedure:         Colonoscopy Indications:       Screening for colorectal malignant neoplasm Providers:         Manya Silvas, MD Referring MD:      Dion Body (Referring MD) Medicines:         Propofol per Anesthesia Complications:     No immediate complications. Procedure:         Pre-Anesthesia Assessment:                    - After reviewing the risks and benefits, the patient was                     deemed in satisfactory condition to undergo the procedure.                    After obtaining informed consent, the colonoscope was                     passed under direct vision. Throughout the procedure, the                     patient's blood pressure, pulse, and oxygen saturations                     were monitored continuously. The Olympus PCF-160AL                     colonoscope (S#. [id number]) was introduced through the anus                     and advanced to the the transverse colon. The colonoscopy                     was extremely difficult due to restricted mobility of the                     colon, significant looping and a tortuous colon. The                     patient tolerated the procedure well. The quality of the                     bowel preparation was excellent. Findings:      A few small-mouthed diverticula were found in the sigmoid colon.      Internal hemorrhoids were found during endoscopy. The hemorrhoids were       small and Grade I (internal hemorrhoids that do not prolapse). The colon       was quite difficult and coulld only get to 1/4 way with scope and       switched to an upper scope and passed to transverse colon but could no       pass it any further. No polyps or masses  seen. Impression:        - Diverticulosis in the sigmoid colon.                    - Internal hemorrhoids.                    -  No specimens collected. Recommendation:    - The findings and recommendations were discussed with the                     patient. and her family. Manya Silvas, MD 03/05/2015 2:35:15 PM This report has been signed electronically. Number of Addenda: 0 Note Initiated On: 03/05/2015 1:06 PM Total Procedure Duration: 0 hours 36 minutes 51 seconds       [facility]

## 2015-03-05 NOTE — Transfer of Care (Signed)
Immediate Anesthesia Transfer of Care Note  Patient: Alison Marshall  Procedure(s) Performed: Procedure(s): COLONOSCOPY WITH PROPOFOL (N/A)  Patient Location: PACU  Anesthesia Type:General  Level of Consciousness: sedated  Airway & Oxygen Therapy: Patient Spontanous Breathing and Patient connected to nasal cannula oxygen  Post-op Assessment: Report given to RN and Post -op Vital signs reviewed and stable  Post vital signs: Reviewed and stable  Last Vitals:  Filed Vitals:   03/05/15 1430  BP: 101/52  Pulse: 55  Temp: 36.2 C  Resp: 14    Complications: No apparent anesthesia complications

## 2015-03-05 NOTE — Anesthesia Postprocedure Evaluation (Signed)
  Anesthesia Post-op Note  Patient: Alison Marshall  Procedure(s) Performed: Procedure(s): COLONOSCOPY WITH PROPOFOL (N/A)  Anesthesia type:General  Patient location: PACU  Post pain: Pain level controlled  Post assessment: Post-op Vital signs reviewed, Patient's Cardiovascular Status Stable, Respiratory Function Stable, Patent Airway and No signs of Nausea or vomiting  Post vital signs: Reviewed and stable  Last Vitals:  Filed Vitals:   03/05/15 1320  BP: 124/58  Pulse: 68  Temp: 36.7 C  Resp: 16    Level of consciousness: awake, alert  and patient cooperative  Complications: No apparent anesthesia complications

## 2015-03-05 NOTE — Anesthesia Postprocedure Evaluation (Signed)
  Anesthesia Post-op Note  Patient: Alison Marshall  Procedure(s) Performed: Procedure(s): COLONOSCOPY WITH PROPOFOL (N/A)  Anesthesia type:General  Patient location: PACU  Post pain: Pain level controlled  Post assessment: Post-op Vital signs reviewed, Patient's Cardiovascular Status Stable, Respiratory Function Stable, Patent Airway and No signs of Nausea or vomiting  Post vital signs: Reviewed and stable  Last Vitals:  Filed Vitals:   03/05/15 1430  BP: 101/52  Pulse: 55  Temp: 36.2 C  Resp: 14    Level of consciousness: awake, alert  and patient cooperative  Complications: No apparent anesthesia complications

## 2015-03-05 NOTE — Anesthesia Preprocedure Evaluation (Addendum)
Anesthesia Evaluation  Patient identified by MRN, date of birth, ID band Patient awake    Reviewed: Allergy & Precautions, NPO status   History of Anesthesia Complications Negative for: history of anesthetic complications  Airway Mallampati: II       Dental   Pulmonary neg pulmonary ROS,          Cardiovascular negative cardio ROS      Neuro/Psych  Headaches, Anxiety    GI/Hepatic negative GI ROS, Neg liver ROS,   Endo/Other  Hypothyroidism   Renal/GU negative Renal ROS  negative genitourinary   Musculoskeletal negative musculoskeletal ROS (+)   Abdominal   Peds negative pediatric ROS (+)  Hematology negative hematology ROS (+)   Anesthesia Other Findings   Reproductive/Obstetrics negative OB ROS                            Anesthesia Physical Anesthesia Plan  ASA: II  Anesthesia Plan: General   Post-op Pain Management:    Induction: Intravenous  Airway Management Planned: Nasal Cannula  Additional Equipment:   Intra-op Plan:   Post-operative Plan:   Informed Consent: I have reviewed the patients History and Physical, chart, labs and discussed the procedure including the risks, benefits and alternatives for the proposed anesthesia with the patient or authorized representative who has indicated his/her understanding and acceptance.     Plan Discussed with: CRNA  Anesthesia Plan Comments:         Anesthesia Quick Evaluation

## 2015-03-05 NOTE — H&P (Signed)
Primary Care Physician:  Dion Body, MD Primary Gastroenterologist:  Dr. Vira Agar  Pre-Procedure History & Physical: HPI:  Alison Marshall is a 74 y.o. female is here for an colonoscopy.   Past Medical History  Diagnosis Date  . Osteoporosis   . Heart murmur   . High cholesterol   . Hypothyroid   . Anxiety     Past Surgical History  Procedure Laterality Date  . Breast surgery      Biopsie  . Bunionectomy      bilateral  . Osteotomy    . 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 Authorizing Provider  acetaminophen (TYLENOL) 650 MG CR tablet Take 650 mg by mouth.   Yes Historical Provider, MD  atorvastatin (LIPITOR) 10 MG tablet Take 10 mg by mouth daily.   Yes Historical Provider, MD  CALCIUM-MAG-VIT C-VIT D PO Take 1 tablet by mouth 3 (three) times daily.   Yes Historical Provider, MD  citalopram (CELEXA) 20 MG tablet Take 20 mg by mouth daily.   Yes Historical Provider, MD  [employer] FIBER GUMMIES PO Take 1 tablet by mouth 3 (three) times daily.   Yes Historical Provider, MD  ibuprofen (ADVIL,MOTRIN) 200 MG tablet Take 400 mg by mouth every 6 (six) hours as needed for moderate pain.   Yes Historical Provider, MD  levothyroxine (SYNTHROID, LEVOTHROID) 75 MCG tablet Take 75 mcg by mouth daily before breakfast.   Yes Historical Provider, MD  Multiple Vitamin (MULTI-VITAMIN PO) Take 1 tablet by mouth 3 (three) times daily.    Yes Historical Provider, MD  senna-docusate (SENOKOT-S) 8.6-50 MG per tablet Take 1 tablet by mouth daily.   Yes Historical Provider, MD  triamcinolone cream (KENALOG) 0.1 % Apply 1 application topically 2 (two) times daily.   Yes Historical Provider, MD    Allergies as of 02/06/2015 - Review Complete 11/05/2011  Allergen Reaction Noted  . Codeine  11/06/2012  . Sulfa antibiotics Hives 11/05/2011    Family History  Problem  Relation Age of Onset  . Heart disease Mother   . Heart disease Father   . Diabetes Father   . Cancer Brother     Pancreatic  . Cancer Paternal Aunt     ovarian    History   Social History  . Marital Status: Single    Spouse Name: N/A  . Number of Children: N/A  . Years of Education: N/A   Occupational History  . Not on file.   Social History Main Topics  . Smoking status: Never Smoker   . Smokeless tobacco: Not on file  . Alcohol Use: No  . Drug Use: No  . Sexual Activity: Not on file   Other Topics Concern  . Not on file   Social History Narrative  . No narrative on file    Review of Systems: See HPI, otherwise negative ROS  Physical Exam: There were no vitals taken for this visit. General:   Alert,  pleasant and cooperative in NAD Head:  Normocephalic and atraumatic.Right eye removed due to cancer Neck:  Supple; no masses or thyromegaly. Lungs:  Clear throughout to auscultation.    Heart:  Regular rate and rhythm. Abdomen:  Soft, nontender and nondistended. Normal bowel sounds, without guarding, and without rebound.   Neurologic:  Alert and  oriented x4;  grossly normal neurologically.  Impression/Plan: Alison Marshall  is here for an colonoscopy to be performed for screening  Risks, benefits, limitations, and alternatives regarding  colonoscopy have been reviewed with the patient.  Questions have been answered.  All parties agreeable.   Alison Cheers, MD  03/05/2015, 1:13 PM   Primary Care Physician:  Dion Body, MD Primary Gastroenterologist:  Dr. Vira Agar  Pre-Procedure History & Physical: HPI:  Alison Marshall is a 74 y.o. female is here for an colonoscopy.   Past Medical History  Diagnosis Date  . Osteoporosis   . Heart murmur   . High cholesterol   . Hypothyroid   . Anxiety     Past Surgical History  Procedure Laterality Date  . Breast surgery      Biopsie  . Bunionectomy      bilateral  . Osteotomy    . Hammer toe surgery       Prior to Admission medications   Medication Sig Start Date End Date Taking? Authorizing Provider  acetaminophen (TYLENOL) 650 MG CR tablet Take 650 mg by mouth.   Yes Historical Provider, MD  atorvastatin (LIPITOR) 10 MG tablet Take 10 mg by mouth daily.   Yes Historical Provider, MD  CALCIUM-MAG-VIT C-VIT D PO Take 1 tablet by mouth 3 (three) times daily.   Yes Historical Provider, MD  citalopram (CELEXA) 20 MG tablet Take 20 mg by mouth daily.   Yes Historical Provider, MD  [employer]S FIBER GUMMIES PO Take 1 tablet by mouth 3 (three) times daily.   Yes Historical Provider, MD  ibuprofen (ADVIL,MOTRIN) 200 MG tablet Take 400 mg by mouth every 6 (six) hours as needed for moderate pain.   Yes Historical Provider, MD  levothyroxine (SYNTHROID, LEVOTHROID) 75 MCG tablet Take 75 mcg by mouth daily before breakfast.   Yes Historical Provider, MD  Multiple Vitamin (MULTI-VITAMIN PO) Take 1 tablet by mouth 3 (three) times daily.    Yes Historical Provider, MD  senna-docusate (SENOKOT-S) 8.6-50 MG per tablet Take 1 tablet by mouth daily.   Yes Historical Provider, MD  triamcinolone cream (KENALOG) 0.1 % Apply 1 application topically 2 (two) times daily.   Yes Historical Provider, MD    Allergies as of 02/06/2015 - Review Complete 11/05/2011  Allergen Reaction Noted  . Codeine  11/06/2012  . Sulfa antibiotics Hives 11/05/2011    Family History  Problem Relation Age of Onset  . Heart disease Mother   . Heart disease Father   . Diabetes Father   . Cancer Brother     Pancreatic  . Cancer Paternal Aunt     ovarian    History   Social History  . Marital Status: Single    Spouse Name: N/A  . Number of Children: N/A  . Years of Education: N/A   Occupational History  . Not on file.   Social History Main Topics  . Smoking status: Never Smoker   . Smokeless tobacco: Not on file  . Alcohol Use: No  . Drug Use: No  . Sexual Activity: Not on file   Other Topics Concern  . Not on file    Social History Narrative  . No narrative on file    Review of Systems: See HPI, otherwise negative ROS  Physical Exam: There were no vitals taken for this visit. General:   Alert,  pleasant and cooperative in NAD Head:  Normocephalic and atraumatic. Neck:  Supple; no masses or thyromegaly. Lungs:  Clear throughout to auscultation.    Heart:  Regular rate and rhythm. Abdomen:  Soft, nontender and nondistended. Normal bowel sounds, without guarding, and without rebound.   Neurologic:  Alert and  oriented x4;  grossly normal neurologically.  Impression/Plan: Alison Marshall is here for an colonoscopy to be performed for screening  Risks, benefits, limitations, and alternatives regarding  colonoscopy have been reviewed with the patient.  Questions have been answered.  All parties agreeable.   Alison Cheers, MD  03/05/2015, 1:13 PM

## 2015-03-12 ENCOUNTER — Encounter: Payer: Self-pay | Admitting: Unknown Physician Specialty

## 2015-03-22 ENCOUNTER — Other Ambulatory Visit: Payer: Self-pay | Admitting: Unknown Physician Specialty

## 2015-03-22 DIAGNOSIS — Z1211 Encounter for screening for malignant neoplasm of colon: Secondary | ICD-10-CM

## 2015-03-30 ENCOUNTER — Other Ambulatory Visit: Payer: Medicare Other

## 2015-04-02 ENCOUNTER — Other Ambulatory Visit: Payer: Self-pay | Admitting: Unknown Physician Specialty

## 2015-04-02 DIAGNOSIS — K5730 Diverticulosis of large intestine without perforation or abscess without bleeding: Secondary | ICD-10-CM

## 2015-04-04 ENCOUNTER — Ambulatory Visit
Admission: RE | Admit: 2015-04-04 | Discharge: 2015-04-04 | Disposition: A | Discharge status: Home / Self Care | Payer: Medicare Other | Source: Ambulatory Visit | Attending: Unknown Physician Specialty | Admitting: Unknown Physician Specialty

## 2015-04-04 DIAGNOSIS — K5730 Diverticulosis of large intestine without perforation or abscess without bleeding: Secondary | ICD-10-CM

## 2016-04-16 DIAGNOSIS — Z87898 Personal history of other specified conditions: Secondary | ICD-10-CM | POA: Insufficient documentation

## 2017-02-24 DIAGNOSIS — Z0000 Encounter for general adult medical examination without abnormal findings: Secondary | ICD-10-CM | POA: Insufficient documentation

## 2019-09-13 ENCOUNTER — Other Ambulatory Visit: Payer: Self-pay | Admitting: Family Medicine

## 2019-09-13 DIAGNOSIS — R7401 Elevation of levels of liver transaminase levels: Secondary | ICD-10-CM

## 2019-09-21 ENCOUNTER — Other Ambulatory Visit: Payer: Self-pay

## 2019-09-21 ENCOUNTER — Ambulatory Visit
Admission: RE | Admit: 2019-09-21 | Discharge: 2019-09-21 | Disposition: A | Discharge status: Home / Self Care | Payer: Medicare PPO | Source: Ambulatory Visit | Attending: Family Medicine | Admitting: Family Medicine

## 2019-09-21 DIAGNOSIS — R7401 Elevation of levels of liver transaminase levels: Secondary | ICD-10-CM | POA: Insufficient documentation

## 2020-08-13 ENCOUNTER — Other Ambulatory Visit: Payer: Self-pay | Admitting: Family Medicine

## 2020-08-13 DIAGNOSIS — R7401 Elevation of levels of liver transaminase levels: Secondary | ICD-10-CM

## 2020-08-30 ENCOUNTER — Ambulatory Visit
Admission: RE | Admit: 2020-08-30 | Discharge: 2020-08-30 | Disposition: A | Discharge status: Home / Self Care | Payer: Medicare PPO | Source: Ambulatory Visit | Attending: Family Medicine | Admitting: Family Medicine

## 2020-08-30 ENCOUNTER — Other Ambulatory Visit: Payer: Self-pay

## 2020-08-30 DIAGNOSIS — K7460 Unspecified cirrhosis of liver: Secondary | ICD-10-CM | POA: Diagnosis not present

## 2020-08-30 DIAGNOSIS — I700 Atherosclerosis of aorta: Secondary | ICD-10-CM | POA: Diagnosis not present

## 2020-08-30 DIAGNOSIS — K5730 Diverticulosis of large intestine without perforation or abscess without bleeding: Secondary | ICD-10-CM | POA: Diagnosis not present

## 2020-08-30 DIAGNOSIS — R7401 Elevation of levels of liver transaminase levels: Secondary | ICD-10-CM | POA: Diagnosis not present

## 2020-08-30 HISTORY — DX: Malignant neoplasm of unspecified site of right eye: C69.91

## 2020-08-30 LAB — POCT I-STAT CREATININE: Creatinine, Ser: 0.9 mg/dL (ref 0.44–1.00)

## 2020-08-30 MED ORDER — IOHEXOL 300 MG/ML  SOLN
85.0000 mL | Freq: Once | INTRAMUSCULAR | Status: AC | PRN
  Administered 2020-08-30: 85 mL via INTRAVENOUS

## 2020-12-31 ENCOUNTER — Other Ambulatory Visit: Payer: Self-pay | Admitting: Gastroenterology

## 2020-12-31 DIAGNOSIS — K7460 Unspecified cirrhosis of liver: Secondary | ICD-10-CM

## 2021-01-17 NOTE — Progress Notes (Signed)
Patient on schedule for Liver biopsy 01/22/2021, called and spoke with patient on phone with pre procedure instructions given. Made aware to be here @ 0830, NPO after Mn prior to procedure as well as driver post procedure/recovery/discharge. Stated understanding.

## 2021-01-21 ENCOUNTER — Other Ambulatory Visit: Payer: Self-pay | Admitting: Student

## 2021-01-22 ENCOUNTER — Ambulatory Visit
Admission: RE | Admit: 2021-01-22 | Discharge: 2021-01-22 | Disposition: A | Discharge status: Home / Self Care | Payer: Medicare PPO | Source: Ambulatory Visit | Attending: Gastroenterology | Admitting: Gastroenterology

## 2021-01-22 ENCOUNTER — Other Ambulatory Visit: Payer: Self-pay

## 2021-01-22 DIAGNOSIS — Z882 Allergy status to sulfonamides status: Secondary | ICD-10-CM | POA: Insufficient documentation

## 2021-01-22 DIAGNOSIS — Z79899 Other long term (current) drug therapy: Secondary | ICD-10-CM | POA: Insufficient documentation

## 2021-01-22 DIAGNOSIS — R7401 Elevation of levels of liver transaminase levels: Secondary | ICD-10-CM | POA: Diagnosis not present

## 2021-01-22 DIAGNOSIS — Z79890 Hormone replacement therapy (postmenopausal): Secondary | ICD-10-CM | POA: Diagnosis not present

## 2021-01-22 DIAGNOSIS — K7460 Unspecified cirrhosis of liver: Secondary | ICD-10-CM | POA: Diagnosis present

## 2021-01-22 DIAGNOSIS — K7689 Other specified diseases of liver: Secondary | ICD-10-CM | POA: Diagnosis not present

## 2021-01-22 DIAGNOSIS — Z885 Allergy status to narcotic agent status: Secondary | ICD-10-CM | POA: Insufficient documentation

## 2021-01-22 DIAGNOSIS — K759 Inflammatory liver disease, unspecified: Secondary | ICD-10-CM | POA: Diagnosis not present

## 2021-01-22 LAB — CBC
HCT: 43 % (ref 36.0–46.0)
Hemoglobin: 14.4 g/dL (ref 12.0–15.0)
MCH: 31.2 pg (ref 26.0–34.0)
MCHC: 33.5 g/dL (ref 30.0–36.0)
MCV: 93.1 fL (ref 80.0–100.0)
Platelets: 252 10*3/uL (ref 150–400)
RBC: 4.62 MIL/uL (ref 3.87–5.11)
RDW: 13.8 % (ref 11.5–15.5)
WBC: 5.8 10*3/uL (ref 4.0–10.5)
nRBC: 0 % (ref 0.0–0.2)

## 2021-01-22 LAB — PROTIME-INR
INR: 1.1 (ref 0.8–1.2)
Prothrombin Time: 13.9 seconds (ref 11.4–15.2)

## 2021-01-22 LAB — APTT: aPTT: 33 seconds (ref 24–36)

## 2021-01-22 MED ORDER — FENTANYL CITRATE (PF) 100 MCG/2ML IJ SOLN
INTRAMUSCULAR | Status: AC
  Filled 2021-01-22: qty 2

## 2021-01-22 MED ORDER — FENTANYL CITRATE (PF) 100 MCG/2ML IJ SOLN
INTRAMUSCULAR | Status: AC | PRN
  Administered 2021-01-22: 25 ug via INTRAVENOUS

## 2021-01-22 MED ORDER — MIDAZOLAM HCL 2 MG/2ML IJ SOLN
INTRAMUSCULAR | Status: AC
  Filled 2021-01-22: qty 2

## 2021-01-22 MED ORDER — SODIUM CHLORIDE 0.9 % IV SOLN
INTRAVENOUS | Status: DC
  Administered 2021-01-22: 1000 mL via INTRAVENOUS

## 2021-01-22 MED ORDER — MIDAZOLAM HCL 2 MG/2ML IJ SOLN
INTRAMUSCULAR | Status: AC | PRN
  Administered 2021-01-22: 0.5 mg via INTRAVENOUS

## 2021-01-22 NOTE — Procedures (Signed)
Interventional Radiology Procedure Note  Procedure: US LIVER BX    Complications: None  Estimated Blood Loss:  0  Findings: 18 G CORES X 2    M. Daryll Brod, MD

## 2021-01-22 NOTE — H&P (Signed)
Chief Complaint: Patient was seen in consultation today for liver bx at the request of Croley,Granville M  Referring Physician(s): Croley,[place]an[place] M  Supervising Physician: Daryll Brod  Patient Status: [facility] - Out-pt  History of Present Illness: Alison Marshall is a 80 y.o. female being worked up for possible NASH cirrhosis. She is referred for image guided random liver biopsy. PMHx, meds, labs, imaging, allergies reviewed. Feels well, no recent fevers, chills, illness. Has been NPO today as directed. Sister at bedside.   Past Medical History:  Diagnosis Date  . Anxiety   . Cancer of right eye [facility])    Surgical resection, chemo + rad tx's.  Marland Kitchen Heart murmur   . High cholesterol   . Hypothyroid   . Osteoporosis     Past Surgical History:  Procedure Laterality Date  . BREAST SURGERY     Biopsie  . BUNIONECTOMY     bilateral  . COLONOSCOPY WITH PROPOFOL N/A 03/05/2015   Procedure: COLONOSCOPY WITH PROPOFOL;  Surgeon: Manya Silvas, MD;  Location: [facility] ENDOSCOPY;  Service: Endoscopy;  Laterality: N/A;  . EYE SURGERY    . HAMMER TOE SURGERY    . OSTEOTOMY      Allergies: Codeine, Paxil [paroxetine hcl], and Sulfa antibiotics  Medications: Prior to Admission medications   Medication Sig Start Date End Date Taking? Authorizing Provider  atorvastatin (LIPITOR) 10 MG tablet Take 10 mg by mouth daily.   Yes [provider]  CALCIUM-MAG-VIT C-VIT D PO Take 1 tablet by mouth 3 (three) times daily.   Yes [provider]  citalopram (CELEXA) 20 MG tablet Take 20 mg by mouth daily.   Yes [provider]  [employer]S FIBER GUMMIES PO Take 1 tablet by mouth 3 (three) times daily.   Yes [provider]  levothyroxine (SYNTHROID, LEVOTHROID) 75 MCG tablet Take 75 mcg by mouth daily before breakfast.   Yes [provider]  Multiple Vitamin (MULTI-VITAMIN PO) Take 1 tablet by mouth 3 (three) times daily.   Yes [provider]   acetaminophen (TYLENOL) 650 MG CR tablet Take 650 mg by mouth.    [provider]  ibuprofen (ADVIL,MOTRIN) 200 MG tablet Take 400 mg by mouth every 6 (six) hours as needed for moderate pain.    [provider]  senna-docusate (SENOKOT-S) 8.6-50 MG per tablet Take 1 tablet by mouth daily.    [provider]  triamcinolone cream (KENALOG) 0.1 % Apply 1 application topically 2 (two) times daily.    [provider]     Family History  Problem Relation Age of Onset  . Heart disease Mother   . Heart disease Father   . Diabetes Father   . Cancer Brother        Pancreatic  . Cancer Paternal Aunt        ovarian    Social History   Socioeconomic History  . Marital status: Single    Spouse name: Not on file  . Number of children: Not on file  . Years of education: Not on file  . Highest education level: Not on file  Occupational History  . Not on file  Tobacco Use  . Smoking status: Never Smoker  . Smokeless tobacco: Never Used  Vaping Use  . Vaping Use: Never used  Substance and Sexual Activity  . Alcohol use: No  . Drug use: No  . Sexual activity: Not on file  Other Topics Concern  . Not on file  Social History Narrative  .  Not on file   Social Determinants of Health   Financial Resource Strain: Not on file  Food Insecurity: Not on file  Transportation Needs: Not on file  Physical Activity: Not on file  Stress: Not on file  Social Connections: Not on file     Review of Systems: A 12 point ROS discussed and pertinent positives are indicated in the HPI above.  All other systems are negative.  Review of Systems  Vital Signs: BP 132/67   Pulse 62   Temp 98.2 F (36.8 C) (Oral)   Resp 14   Ht 5' 5.5" (1.664 m)   Wt 45.4 kg   SpO2 98%   BMI 16.39 kg/m   Physical Exam HENT:     Mouth/Throat:     Mouth: Mucous membranes are moist.     Pharynx: Oropharynx is clear.  Cardiovascular:     Rate and Rhythm: Normal rate and  regular rhythm.     Heart sounds: Normal heart sounds.  Pulmonary:     Effort: Pulmonary effort is normal. No respiratory distress.     Breath sounds: Normal breath sounds.  Skin:    General: Skin is warm and dry.  Neurological:     General: No focal deficit present.     Mental Status: She is alert and oriented to person, place, and time.  Psychiatric:        Mood and Affect: Mood normal.        Thought Content: Thought content normal.        Judgment: Judgment normal.     Imaging: No results found.  Labs:  CBC: Recent Labs    01/22/21 0904  WBC 5.8  HGB 14.4  HCT 43.0  PLT 252    COAGS: No results for input(s): INR, APTT in the last 8760 hours.  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 Risks and benefits of liver bx was discussed with the patient and/or patient's family including, but not limited to bleeding, infection, damage to adjacent structures or low yield requiring additional tests.  All of the questions were answered and there is agreement to proceed.  Consent signed and in chart.    Thank you for this interesting consult.  I greatly enjoyed meeting [employer] and look forward to participating in their care.  A copy of this report was sent to the requesting provider on this date.  Electronically Signed: Ascencion Dike, PA-C 01/22/2021, 9:21 AM   I spent a total of 20 minutes in face to face in clinical consultation, greater than 50% of which was counseling/coordinating care for liver bx

## 2021-01-24 LAB — SURGICAL PATHOLOGY

## 2021-02-11 ENCOUNTER — Emergency Department
Admission: EM | Admit: 2021-02-11 | Discharge: 2021-02-11 | Discharge status: Against Medical Advice | Payer: Medicare PPO

## 2021-10-08 ENCOUNTER — Other Ambulatory Visit: Payer: Self-pay | Admitting: Gastroenterology

## 2021-10-08 DIAGNOSIS — K754 Autoimmune hepatitis: Secondary | ICD-10-CM

## 2021-10-08 DIAGNOSIS — T380X5S Adverse effect of glucocorticoids and synthetic analogues, sequela: Secondary | ICD-10-CM

## 2021-10-15 ENCOUNTER — Ambulatory Visit: Payer: Medicare PPO | Attending: Gastroenterology

## 2022-04-28 ENCOUNTER — Ambulatory Visit
Admission: RE | Admit: 2022-04-28 | Discharge: 2022-04-28 | Disposition: A | Discharge status: Home / Self Care | Payer: Medicare PPO | Source: Ambulatory Visit | Attending: Gastroenterology | Admitting: Gastroenterology

## 2022-04-28 DIAGNOSIS — T380X5S Adverse effect of glucocorticoids and synthetic analogues, sequela: Secondary | ICD-10-CM | POA: Diagnosis present

## 2022-04-28 DIAGNOSIS — K754 Autoimmune hepatitis: Secondary | ICD-10-CM | POA: Diagnosis present

## 2022-10-09 ENCOUNTER — Other Ambulatory Visit: Payer: Self-pay | Admitting: General Surgery

## 2022-10-09 DIAGNOSIS — K754 Autoimmune hepatitis: Secondary | ICD-10-CM

## 2022-10-09 DIAGNOSIS — K7460 Unspecified cirrhosis of liver: Secondary | ICD-10-CM

## 2022-10-17 ENCOUNTER — Ambulatory Visit
Admission: RE | Admit: 2022-10-17 | Discharge: 2022-10-17 | Disposition: A | Discharge status: Home / Self Care | Payer: Medicare PPO | Source: Ambulatory Visit | Attending: General Surgery | Admitting: General Surgery

## 2022-10-17 DIAGNOSIS — K754 Autoimmune hepatitis: Secondary | ICD-10-CM | POA: Diagnosis present

## 2022-10-17 DIAGNOSIS — K7460 Unspecified cirrhosis of liver: Secondary | ICD-10-CM | POA: Diagnosis present

## 2022-11-19 ENCOUNTER — Ambulatory Visit: Payer: Medicare PPO | Admitting: Dermatology

## 2022-11-19 VITALS — BP 103/50 | HR 60

## 2022-11-19 DIAGNOSIS — R21 Rash and other nonspecific skin eruption: Secondary | ICD-10-CM

## 2022-11-19 DIAGNOSIS — L821 Other seborrheic keratosis: Secondary | ICD-10-CM

## 2022-11-19 DIAGNOSIS — D1801 Hemangioma of skin and subcutaneous tissue: Secondary | ICD-10-CM

## 2022-11-19 DIAGNOSIS — L259 Unspecified contact dermatitis, unspecified cause: Secondary | ICD-10-CM

## 2022-11-19 DIAGNOSIS — L578 Other skin changes due to chronic exposure to nonionizing radiation: Secondary | ICD-10-CM | POA: Diagnosis not present

## 2022-11-19 MED ORDER — TRIAMCINOLONE ACETONIDE 0.1 % EX OINT: TOPICAL_OINTMENT | CUTANEOUS | 0 refills | Status: DC

## 2022-11-19 NOTE — Progress Notes (Signed)
   New Patient Visit  Subjective  Alison Marshall is a 82 y.o. female who presents for the following: New Patient (Initial Visit) (Patient here with sister regarding a rash that started at arms and legs several weeks ago. Patient reports used Eucerin lotion and rash has improved some. ). The patient has spots, moles and lesions to be evaluated, some may be new or changing and the patient has concerns that these could be cancer.  The following portions of the chart were reviewed this encounter and updated as appropriate:   Tobacco  Allergies  Meds  Problems  Med Hx  Surg Hx  Fam Hx     Review of Systems:  No other skin or systemic complaints except as noted in HPI or Assessment and Plan.  Objective  Well appearing patient in no apparent distress; mood and affect are within normal limits.  A focused examination was performed including arms, legs, chest, abdomen and back. Relevant physical exam findings are noted in the Assessment and Plan.  b/l legs, b/l arms Pinkness and peeling of lower legs , back chest and abdomen clear   Assessment & Plan  Rash and other nonspecific skin eruption b/l legs, b/l arms Contact dermatitis with undertermined agent  Start tmc 0.1 % ointment (1lb Jar) - apply thin layer topically to aa at arms and legs qd M-F (only 5 days per week as needed) .  Avoid applying to face, groin, and axilla. Use as directed. Long-term use can cause thinning of the skin. Can use for up to 4 weeks.   If rash goes away d/c TMC cream..   Topical steroids (such as triamcinolone, fluocinolone, fluocinonide, mometasone, clobetasol, halobetasol, betamethasone, hydrocortisone) can cause thinning and lightening of the skin if they are used for too long in the same area. Your physician has selected the right strength medicine for your problem and area affected on the body. Please use your medication only as directed by your physician to prevent side effects.   Start otc cerave  cream - apply to body as a daily moisturizer and rash areas on weekends.  Discussed True patch test in future if rash returns  triamcinolone ointment (KENALOG) 0.1 % - b/l legs, b/l arms Apply thin layer topically to aa of rash at legs and arms once daily M-F Avoid applying to face, groin, and axilla. Use as directed.  Seborrheic Keratoses - Stuck-on, waxy, tan-brown papules and/or plaques  - Benign-appearing - Discussed benign etiology and prognosis. - Observe - Call for any changes  Hemangiomas - Red papules - Discussed benign nature - Observe - Call for any changes  Actinic Damage - chronic, secondary to cumulative UV radiation exposure/sun exposure over time - diffuse scaly erythematous macules with underlying dyspigmentation - Recommend daily broad spectrum sunscreen SPF 30+ to sun-exposed areas, reapply every 2 hours as needed.  - Recommend staying in the shade or wearing long sleeves, sun glasses (UVA+UVB protection) and wide brim hats (4-inch brim around the entire circumference of the hat). - Call for new or changing lesions.  Return if symptoms worsen or fail to improve.  IRuthell Rummage, CMA, am acting as scribe for Sarina Ser, MD. Documentation: I have reviewed the above documentation for accuracy and completeness, and I agree with the above.  Sarina Ser, MD

## 2022-11-19 NOTE — Patient Instructions (Addendum)
For itchy rash at arms and legs   Start Triamcinolone ointment 0.1 % apply thin layer topically to affected areas of rash at legs and arms daily Monday through Friday. If rash is gone do not use. Only use when flared. Avoid applying to face, groin, and axilla. Use as directed. Long-term use can cause thinning of the skin.  On weekends - Use Cerave Moisturizer Cream to skin daily.     Due to recent changes in healthcare laws, you may see results of your pathology and/or laboratory studies on MyChart before the doctors have had a chance to review them. We understand that in some cases there may be results that are confusing or concerning to you. Please understand that not all results are received at the same time and often the doctors may need to interpret multiple results in order to provide you with the best plan of care or course of treatment. Therefore, we ask that you please give [place] 2 business days to thoroughly review all your results before contacting the office for clarification. Should we see a critical lab result, you will be contacted sooner.   If You Need Anything After Your Visit  If you have any questions or concerns for your doctor, please call our main line at [phone] and press option 4 to reach your doctor's medical assistant. If no one answers, please leave a voicemail as directed and we will return your call as soon as possible. Messages left after 4 pm will be answered the following business day.   You may also send [place] a message via [employer]. We typically respond to MyChart messages within 1-2 business days.  For prescription refills, please ask your pharmacy to contact our office. Our fax number is [phone].  If you have an urgent issue when the clinic is closed that cannot wait until the next business day, you can page your doctor at the number below.    Please note that while we do our best to be available for urgent issues outside of office hours, we are not  available 24/7.   If you have an urgent issue and are unable to reach [place], you may choose to seek medical care at your doctor's office, retail clinic, urgent care center, or emergency room.  If you have a medical emergency, please immediately call 911 or go to the emergency department.  Pager Numbers  - Dr. Nehemiah Massed: [phone]  - Dr. Laurence Ferrari: [phone]  - Dr. Nicole Kindred: [phone]  In the event of inclement weather, please call our main line at [phone] for an update on the status of any delays or closures.  Dermatology Medication Tips: Please keep the boxes that topical medications come in in order to help keep track of the instructions about where and how to use these. Pharmacies typically print the medication instructions only on the boxes and not directly on the medication tubes.   If your medication is too expensive, please contact our office at [phone] option 4 or send [place] a message through [place].   We are unable to tell what your co-pay for medications will be in advance as this is different depending on your insurance coverage. However, we may be able to find a substitute medication at lower cost or fill out paperwork to get insurance to cover a needed medication.   If a prior authorization is required to get your medication covered by your insurance company, please allow [place] 1-2 business days to complete this process.  Drug prices often vary  depending on where the prescription is filled and some pharmacies may offer cheaper prices.  The website www.goodrx.com contains coupons for medications through different pharmacies. The prices here do not account for what the cost may be with help from insurance (it may be cheaper with your insurance), but the website can give you the price if you did not use any insurance.  - You can print the associated coupon and take it with your prescription to the pharmacy.  - You may also stop by our office during regular business hours  and pick up a GoodRx coupon card.  - If you need your prescription sent electronically to a different pharmacy, notify our office through [facility] or by phone at [phone] option 4.     Si Usted Necesita Algo Despus de Su Visita  Tambin puede enviarnos un mensaje a travs de Pharmacist, community. Por lo general respondemos a los mensajes de MyChart en el transcurso de 1 a 2 das hbiles.  Para renovar recetas, por favor pida a su farmacia que se ponga en contacto con nuestra oficina. Harland Dingwall de fax es [place] [phone].  Si tiene un asunto urgente cuando la clnica est cerrada y que no puede esperar hasta el siguiente da hbil, puede llamar/localizar a su doctor(a) al nmero que aparece a continuacin.   Por favor, tenga en cuenta que aunque hacemos todo lo posible para estar disponibles para asuntos urgentes fuera del horario de [place], no estamos disponibles las 24 horas del da, los 7 das de la [place].   Si tiene un problema urgente y no puede comunicarse con nosotros, puede optar por buscar atencin mdica  en el consultorio de su doctor(a), en una clnica privada, en un centro de atencin urgente o en una sala de emergencias.  Si tiene Engineering geologist, por favor llame inmediatamente al 911 o vaya a la sala de emergencias.  Nmeros de bper  - Dr. Nehemiah Massed: [phone]  - Dra. Moye: [phone]  - Dra. Nicole Kindred: [phone]  En caso de inclemencias del [place], por favor llame a Johnsie Kindred principal al [phone] para una actualizacin sobre el [place] de cualquier retraso o cierre.  Consejos para la medicacin en dermatologa: Por favor, guarde las cajas en las que vienen los medicamentos de uso tpico para ayudarle a seguir las instrucciones sobre dnde y cmo usarlos. Las farmacias generalmente imprimen las instrucciones del medicamento slo en las cajas y no directamente en los tubos del [place].   Si su medicamento es muy caro, por favor, pngase  en contacto con Zigmund Daniel llamando al [phone] y presione la opcin 4 o envenos un mensaje a travs de Pharmacist, community.   No podemos decirle cul ser su copago por los medicamentos por adelantado ya que esto es diferente dependiendo de la cobertura de su seguro. Sin embargo, es posible que podamos encontrar un medicamento sustituto a Electrical engineer un formulario para que el seguro cubra el medicamento que se considera necesario.   Si se requiere una autorizacin previa para que su compaa de seguros [place] su medicamento, por favor permtanos de 1 a 2 das hbiles para completar este proceso.  Los precios de los medicamentos varan con frecuencia dependiendo del Environmental consultant de dnde se surte la receta y alguna farmacias pueden ofrecer precios ms baratos.  El sitio web www.goodrx.com tiene cupones para medicamentos de Airline pilot. Los precios aqu no tienen en cuenta lo que podra costar con la ayuda del seguro (puede ser ms barato con su seguro),  pero el sitio web puede darle el precio si no Field seismologist.  - Puede imprimir el cupn correspondiente y llevarlo con su receta a la farmacia.  - Tambin puede pasar por nuestra oficina durante el horario de atencin regular y Charity fundraiser una tarjeta de cupones de GoodRx.  - Si necesita que su receta se enve electrnicamente a una farmacia diferente, informe a nuestra oficina a travs de MyChart de [place] o por telfono llamando al [phone] y presione la opcin 4.

## 2022-11-23 ENCOUNTER — Encounter: Payer: Self-pay | Admitting: Dermatology

## 2023-03-10 ENCOUNTER — Other Ambulatory Visit: Payer: Self-pay | Admitting: Dermatology

## 2023-03-10 DIAGNOSIS — R21 Rash and other nonspecific skin eruption: Secondary | ICD-10-CM

## 2023-03-11 NOTE — Telephone Encounter (Signed)
Left voicemail for patient to return my call. 

## 2023-04-10 ENCOUNTER — Other Ambulatory Visit: Payer: Self-pay | Admitting: Gastroenterology

## 2023-04-10 DIAGNOSIS — K754 Autoimmune hepatitis: Secondary | ICD-10-CM

## 2023-04-10 DIAGNOSIS — K7460 Unspecified cirrhosis of liver: Secondary | ICD-10-CM

## 2023-04-20 ENCOUNTER — Encounter: Payer: Self-pay | Admitting: Dermatology

## 2023-04-20 ENCOUNTER — Ambulatory Visit
Admission: RE | Admit: 2023-04-20 | Discharge: 2023-04-20 | Disposition: A | Discharge status: Home / Self Care | Payer: Medicare PPO | Source: Ambulatory Visit | Attending: Gastroenterology | Admitting: Gastroenterology

## 2023-04-20 ENCOUNTER — Ambulatory Visit: Payer: Medicare PPO | Admitting: Dermatology

## 2023-04-20 VITALS — BP 136/73 | HR 60 | Resp 20 | Ht 65.0 in | Wt 110.0 lb

## 2023-04-20 DIAGNOSIS — L578 Other skin changes due to chronic exposure to nonionizing radiation: Secondary | ICD-10-CM | POA: Diagnosis not present

## 2023-04-20 DIAGNOSIS — K754 Autoimmune hepatitis: Secondary | ICD-10-CM | POA: Diagnosis present

## 2023-04-20 DIAGNOSIS — L820 Inflamed seborrheic keratosis: Secondary | ICD-10-CM | POA: Diagnosis not present

## 2023-04-20 DIAGNOSIS — L821 Other seborrheic keratosis: Secondary | ICD-10-CM

## 2023-04-20 DIAGNOSIS — W908XXA Exposure to other nonionizing radiation, initial encounter: Secondary | ICD-10-CM | POA: Diagnosis not present

## 2023-04-20 DIAGNOSIS — L570 Actinic keratosis: Secondary | ICD-10-CM

## 2023-04-20 DIAGNOSIS — D492 Neoplasm of unspecified behavior of bone, soft tissue, and skin: Secondary | ICD-10-CM

## 2023-04-20 DIAGNOSIS — K7460 Unspecified cirrhosis of liver: Secondary | ICD-10-CM | POA: Insufficient documentation

## 2023-04-20 DIAGNOSIS — S51811A Laceration without foreign body of right forearm, initial encounter: Secondary | ICD-10-CM

## 2023-04-20 MED ORDER — MUPIROCIN 2 % EX OINT: 1.0000 | TOPICAL_OINTMENT | Freq: Two times a day (BID) | CUTANEOUS | 0 refills | Status: AC

## 2023-04-20 NOTE — Patient Instructions (Addendum)
Patient Handout: Wound Care for Skin Biopsy Site  Taking Care of Your Skin Biopsy Site  Proper care of the biopsy site is essential for promoting healing and minimizing scarring. This handout provides instructions on how to care for your biopsy site to ensure optimal recovery.  1. Cleaning the Wound:  Clean the biopsy site daily with gentle soap and water. Gently pat the area dry with a clean, soft towel. Avoid harsh scrubbing or rubbing the area, as this can irritate the skin and delay healing.  2. Applying Aquaphor and Bandage:  After cleaning the wound, apply a thin layer of Aquaphor ointment to the biopsy site. Cover the area with a sterile bandage to protect it from dirt, bacteria, and friction. Change the bandage daily or as needed if it becomes soiled or wet.  3. Continued Care for One Week:  Repeat the cleaning, Aquaphor application, and bandaging process daily for one week following the biopsy procedure. Keeping the wound clean and moist during this initial healing period will help prevent infection and promote optimal healing.  4. Massaging Aquaphor into the Area:  ---After one week, discontinue the use of bandages but continue to apply Aquaphor to the biopsy site. ----Gently massage the Aquaphor into the area using circular motions. ---Massaging the skin helps to promote circulation and prevent the formation of scar tissue.   Additional Tips:  Avoid exposing the biopsy site to direct sunlight during the healing process, as this can cause hyperpigmentation or worsen scarring. If you experience any signs of infection, such as increased redness, swelling, warmth, or drainage from the wound, contact your healthcare provider immediately. Follow any additional instructions provided by your healthcare provider for caring for the biopsy site and managing any discomfort. Conclusion:  Taking proper care of your skin biopsy site is crucial for ensuring optimal healing and  minimizing scarring. By following these instructions for cleaning, applying Aquaphor, and massaging the area, you can promote a smooth and successful recovery. If you have any questions or concerns about caring for your biopsy site, don't hesitate to contact your healthcare provider for guidance.    Due to recent changes in healthcare laws, you may see results of your pathology and/or laboratory studies on MyChart before the doctors have had a chance to review them. We understand that in some cases there may be results that are confusing or concerning to you. Please understand that not all results are received at the same time and often the doctors may need to interpret multiple results in order to provide you with the best plan of care or course of treatment. Therefore, we ask that you please give [place] 2 business days to thoroughly review all your results before contacting the office for clarification. Should we see a critical lab result, you will be contacted sooner.   If You Need Anything After Your Visit  If you have any questions or concerns for your doctor, please call our main line at [phone] and press option 4 to reach your doctor's medical assistant. If no one answers, please leave a voicemail as directed and we will return your call as soon as possible. Messages left after 4 pm will be answered the following business day.   You may also send [place] a message via MyChart. We typically respond to MyChart messages within 1-2 business days.  For prescription refills, please ask your pharmacy to contact our office. Our fax number is [phone].  If you have an urgent issue when the clinic is closed  that cannot wait until the next business day, you can page your doctor at the number below.    Please note that while we do our best to be available for urgent issues outside of office hours, we are not available 24/7.   If you have an urgent issue and are unable to reach [place], you may choose to seek  medical care at your doctor's office, retail clinic, urgent care center, or emergency room.  If you have a medical emergency, please immediately call 911 or go to the emergency department.  Pager Numbers  - Dr. Gwen Pounds: [phone]  - Dr. Roseanne Reno: [phone]  In the event of inclement weather, please call our main line at [phone] for an update on the status of any delays or closures.  Dermatology Medication Tips: Please keep the boxes that topical medications come in in order to help keep track of the instructions about where and how to use these. Pharmacies typically print the medication instructions only on the boxes and not directly on the medication tubes.   If your medication is too expensive, please contact our office at [phone] option 4 or send [place] a message through MyChart.   We are unable to tell what your co-pay for medications will be in advance as this is different depending on your insurance coverage. However, we may be able to find a substitute medication at lower cost or fill out paperwork to get insurance to cover a needed medication.   If a prior authorization is required to get your medication covered by your insurance company, please allow [place] 1-2 business days to complete this process.  Drug prices often vary depending on where the prescription is filled and some pharmacies may offer cheaper prices.  The website www.goodrx.com contains coupons for medications through different pharmacies. The prices here do not account for what the cost may be with help from insurance (it may be cheaper with your insurance), but the website can give you the price if you did not use any insurance.  - You can print the associated coupon and take it with your prescription to the pharmacy.  - You may also stop by our office during regular business hours and pick up a GoodRx coupon card.  - If you need your prescription sent electronically to a different pharmacy, notify our  office through [facility] or by phone at [phone] option 4.     Si Usted Necesita Algo Despus de Su Visita  Tambin puede enviarnos un mensaje a travs de Clinical cytogeneticist. Por lo general respondemos a los mensajes de MyChart en el transcurso de 1 a 2 das hbiles.  Para renovar recetas, por favor pida a su farmacia que se ponga en contacto con nuestra oficina. Annie Sable de fax es [phone].  Si tiene un asunto urgente cuando la clnica est cerrada y que no puede esperar hasta el siguiente da hbil, puede llamar/localizar a su doctor(a) al nmero que aparece a continuacin.   Por favor, tenga en cuenta que aunque hacemos todo lo posible para estar disponibles para asuntos urgentes fuera del horario de [place], no estamos disponibles las 24 horas del da, los 7 [address]  [address] de la [place].   Si tiene un problema urgente y no puede comunicarse con nosotros, puede optar por buscar atencin mdica  en el consultorio de su doctor(a), en una clnica privada, en un centro de atencin urgente o en una sala de emergencias.  Si tiene una emergencia mdica, por favor llame inmediatamente al 911  o vaya a la sala de emergencias.  Nmeros de bper  - Dr. Gwen Pounds: [phone]  - Dra. Roseanne Reno: [phone]  En caso de inclemencias del [place], por favor llame a Lacy Duverney principal al [phone] para una actualizacin sobre el [place] de cualquier retraso o cierre.  Consejos para la medicacin en dermatologa: Por favor, guarde las cajas en las que vienen los medicamentos de uso tpico para ayudarle a seguir las instrucciones sobre dnde y cmo usarlos. Las farmacias generalmente imprimen las instrucciones del medicamento slo en las cajas y no directamente en los tubos del [place].   Si su medicamento es muy caro, por favor, pngase en contacto con Rolm Gala llamando al [phone] y presione la opcin 4 o envenos un mensaje a travs de Clinical cytogeneticist.   No podemos decirle cul  ser su copago por los medicamentos por adelantado ya que esto es diferente dependiendo de la cobertura de su seguro. Sin embargo, es posible que podamos encontrar un medicamento sustituto a Audiological scientist un formulario para que el seguro cubra el medicamento que se considera necesario.   Si se requiere una autorizacin previa para que su compaa de seguros [place] su medicamento, por favor permtanos de 1 a 2 das hbiles para completar [address]  Los precios de los medicamentos varan con frecuencia dependiendo del Environmental consultant de dnde se surte la receta y alguna farmacias pueden ofrecer precios ms baratos.  El sitio web www.goodrx.com tiene cupones para medicamentos de Health and safety inspector. Los precios aqu no tienen en cuenta lo que podra costar con la ayuda del seguro (puede ser ms barato con su seguro), pero el sitio web puede darle el precio si no utiliz Tourist information centre manager.  - Puede imprimir el cupn correspondiente y llevarlo con su receta a la farmacia.  - Tambin puede pasar por nuestra oficina durante el horario de atencin regular y Education officer, museum una tarjeta de cupones de GoodRx.  - Si necesita que su receta se enve electrnicamente a una farmacia diferente, informe a nuestra oficina a travs de MyChart de [place] o por telfono llamando al [phone] y presione la opcin 4.

## 2023-04-20 NOTE — Progress Notes (Signed)
Follow-Up Visit   Subjective  Alison Marshall is a 82 y.o. female who presents for the following: Skin Tag & Spot Check  Patient present today for follow up visit for Skin Tag and spot check. Patient was last evaluated on 11/19/2022. Patient reports no medication changes. Patient states a few spots she has located at the B/L Cheeks, Right arm that she would like to have examined. Patient reports the areas have been there for a few weeks. She reports the areas are not bothersome. She states that the areas have not spread. Patient reports has not previously been treated for these areas. Patient denies  Hx of bx. Patient reports family history of skin cancer(s).  The patient has spots, moles and lesions to be evaluated, some may be new or changing and the patient may have concern these could be cancer.  The following portions of the chart were reviewed this encounter and updated as appropriate: medications, allergies, medical history  Review of Systems:  No other skin or systemic complaints except as noted in HPI or Assessment and Plan.  Objective  Well appearing patient in no apparent distress; mood and affect are within normal limits.  A focused examination was performed of the following areas: Head; neck; arms  Relevant exam findings are noted in the Assessment and Plan.  Right Forearm - Posterior Cutaneous Horn 0.7cm  Left Malar Cheek Erythematous thin papules/macules with gritty scale.   Left Anterior Mandible, Right Anterior Mandible Stuck-on, waxy, tan-brown papules and plaques -- Discussed benign etiology and prognosis.                  Assessment & Plan    Neoplasm of skin Right Forearm - Posterior  Epidermal / dermal shaving  Lesion diameter (cm):  0.7 Informed consent: discussed and consent obtained   Timeout: patient name, date of birth, surgical site, and procedure verified   Procedure prep:  Patient was prepped and draped in usual sterile  fashion Prep type:  Isopropyl alcohol Anesthesia: the lesion was anesthetized in a standard fashion   Anesthetic:  1% lidocaine w/ epinephrine 1-100,000 buffered w/ 8.4% NaHCO3 Instrument used: flexible razor blade   Hemostasis achieved with: pressure, aluminum chloride and electrodesiccation   Outcome: patient tolerated procedure well   Post-procedure details: sterile dressing applied and wound care instructions given   Dressing type: bandage and petrolatum    Destruction of lesion Complexity: extensive   Destruction method: electrodesiccation and curettage   Informed consent: discussed and consent obtained   Timeout:  patient name, date of birth, surgical site, and procedure verified Procedure prep:  Patient was prepped and draped in usual sterile fashion Prep type:  Isopropyl alcohol Anesthesia: the lesion was anesthetized in a standard fashion   Anesthetic:  1% lidocaine w/ epinephrine 1-100,000 buffered w/ 8.4% NaHCO3 Curettage performed in three different directions: Yes   Electrodesiccation performed over the curetted area: Yes   Lesion length (cm):  0.7 Lesion width (cm):  0.7 Margin per side (cm):  0.2 Final wound size (cm):  1.1 Hemostasis achieved with:  pressure, aluminum chloride and electrodesiccation Outcome: patient tolerated procedure well with no complications   Post-procedure details: sterile dressing applied and wound care instructions given   Dressing type: bandage and petrolatum    Specimen 1 - Surgical pathology Differential Diagnosis: R/O SCC  Check Margins: No  AK (actinic keratosis) Left Malar Cheek  Destruction of lesion - Left Malar Cheek Complexity: simple   Destruction method: cryotherapy   Informed  consent: discussed and consent obtained   Timeout:  patient name, date of birth, surgical site, and procedure verified Lesion destroyed using liquid nitrogen: Yes   Region frozen until ice ball extended beyond lesion: Yes   Outcome: patient  tolerated procedure well with no complications   Post-procedure details: wound care instructions given    Skin tear of right forearm without complication, initial encounter  Related Medications mupirocin ointment (BACTROBAN) 2 % Apply 1 Application topically 2 (two) times daily.  Inflamed seborrheic keratosis (2) Left Anterior Mandible; Right Anterior Mandible  Symptomatic, irritating, patient would like treated.  Benign-appearing.  Call clinic for new or changing lesions.    Destruction of lesion - Left Anterior Mandible, Right Anterior Mandible Complexity: extensive   Destruction method: electrodesiccation and curettage   Informed consent: discussed and consent obtained   Timeout:  patient name, date of birth, surgical site, and procedure verified Procedure prep:  Patient was prepped and draped in usual sterile fashion Prep type:  Isopropyl alcohol Anesthesia: the lesion was anesthetized in a standard fashion   Anesthetic:  1% lidocaine w/ epinephrine 1-100,000 buffered w/ 8.4% NaHCO3 Curettage performed in three different directions: Yes   Electrodesiccation performed over the curetted area: Yes   Hemostasis achieved with:  pressure, aluminum chloride and electrodesiccation Outcome: patient tolerated procedure well with no complications   Post-procedure details: sterile dressing applied and wound care instructions given   Dressing type: bandage and petrolatum    Seborrheic keratosis  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  - Recommend staying in the shade or wearing long sleeves, sun glasses (UVA+UVB protection) and wide brim hats (4-inch brim around the entire circumference of the hat). - Call for new or changing  lesions.  SEBORRHEIC KERATOSIS - Stuck-on, waxy, tan-brown papules and/or plaques  - Benign-appearing - Discussed benign etiology and prognosis. - Observe - Call for any changes  Return as scheduled  Documentation: I have reviewed the above documentation for accuracy and completeness, and I agree with the above.  Stasia Cavalier, am acting as scribe for Armida Sans, MD.  Armida Sans, MD  Documentation: I have reviewed the above documentation for accuracy and completeness, and I agree with the above.  Armida Sans, MD

## 2023-04-29 ENCOUNTER — Telehealth: Payer: Self-pay

## 2023-04-29 ENCOUNTER — Other Ambulatory Visit: Payer: Self-pay | Admitting: Dermatology

## 2023-04-29 DIAGNOSIS — R21 Rash and other nonspecific skin eruption: Secondary | ICD-10-CM

## 2023-04-29 NOTE — Telephone Encounter (Signed)
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

## 2023-04-29 NOTE — Telephone Encounter (Signed)
Advised patient of results/hd  

## 2023-07-22 ENCOUNTER — Ambulatory Visit: Payer: Medicare PPO | Admitting: Dermatology

## 2023-10-13 ENCOUNTER — Other Ambulatory Visit: Payer: Self-pay | Admitting: Gastroenterology

## 2023-10-13 DIAGNOSIS — K7460 Unspecified cirrhosis of liver: Secondary | ICD-10-CM

## 2023-10-13 DIAGNOSIS — K754 Autoimmune hepatitis: Secondary | ICD-10-CM

## 2023-10-19 ENCOUNTER — Ambulatory Visit: Admission: RE | Admit: 2023-10-19 | Payer: Medicare PPO | Source: Ambulatory Visit

## 2024-04-12 ENCOUNTER — Other Ambulatory Visit: Payer: Self-pay | Admitting: Gastroenterology

## 2024-04-12 DIAGNOSIS — K7460 Unspecified cirrhosis of liver: Secondary | ICD-10-CM

## 2024-04-12 DIAGNOSIS — K754 Autoimmune hepatitis: Secondary | ICD-10-CM

## 2024-05-02 ENCOUNTER — Ambulatory Visit
Admission: RE | Admit: 2024-05-02 | Discharge: 2024-05-02 | Disposition: A | Discharge status: Home / Self Care | Source: Ambulatory Visit | Attending: Gastroenterology | Admitting: Gastroenterology

## 2024-05-02 DIAGNOSIS — K754 Autoimmune hepatitis: Secondary | ICD-10-CM | POA: Insufficient documentation

## 2024-05-02 DIAGNOSIS — K7460 Unspecified cirrhosis of liver: Secondary | ICD-10-CM | POA: Insufficient documentation

## 2024-10-07 ENCOUNTER — Other Ambulatory Visit: Payer: Self-pay | Admitting: Gastroenterology

## 2024-10-07 DIAGNOSIS — K7460 Unspecified cirrhosis of liver: Secondary | ICD-10-CM

## 2024-10-07 DIAGNOSIS — K754 Autoimmune hepatitis: Secondary | ICD-10-CM

## 2024-11-07 ENCOUNTER — Ambulatory Visit
